# Patient Record
Sex: Female | Born: 1935 | Race: Black or African American | Hispanic: No | Smoking: Never smoker
Health system: Southern US, Community
[De-identification: ages and names within clinical notes are randomized; demographics above are authoritative.]

## PROBLEM LIST (undated history)

## (undated) DIAGNOSIS — R059 Cough, unspecified: Secondary | ICD-10-CM

## (undated) DIAGNOSIS — J189 Pneumonia, unspecified organism: Secondary | ICD-10-CM

## (undated) DIAGNOSIS — Z972 Presence of dental prosthetic device (complete) (partial): Secondary | ICD-10-CM

## (undated) DIAGNOSIS — G4730 Sleep apnea, unspecified: Secondary | ICD-10-CM

## (undated) DIAGNOSIS — I10 Essential (primary) hypertension: Secondary | ICD-10-CM

## (undated) DIAGNOSIS — E039 Hypothyroidism, unspecified: Secondary | ICD-10-CM

## (undated) DIAGNOSIS — F32A Depression, unspecified: Secondary | ICD-10-CM

## (undated) DIAGNOSIS — E079 Disorder of thyroid, unspecified: Secondary | ICD-10-CM

## (undated) DIAGNOSIS — R42 Dizziness and giddiness: Secondary | ICD-10-CM

## (undated) DIAGNOSIS — R05 Cough: Secondary | ICD-10-CM

## (undated) DIAGNOSIS — F329 Major depressive disorder, single episode, unspecified: Secondary | ICD-10-CM

## (undated) DIAGNOSIS — M1990 Unspecified osteoarthritis, unspecified site: Secondary | ICD-10-CM

## (undated) DIAGNOSIS — H9190 Unspecified hearing loss, unspecified ear: Secondary | ICD-10-CM

## (undated) DIAGNOSIS — J302 Other seasonal allergic rhinitis: Secondary | ICD-10-CM

## (undated) DIAGNOSIS — E119 Type 2 diabetes mellitus without complications: Secondary | ICD-10-CM

## (undated) HISTORY — DX: Disorder of thyroid, unspecified: E07.9

## (undated) HISTORY — PX: TUBAL LIGATION: SHX77

## (undated) HISTORY — PX: ABDOMINAL HYSTERECTOMY: SHX81

## (undated) HISTORY — PX: COLONOSCOPY: SHX174

## (undated) HISTORY — PX: CHOLECYSTECTOMY: SHX55

---

## 2006-02-01 ENCOUNTER — Ambulatory Visit: Payer: Self-pay | Admitting: Family Medicine

## 2007-04-12 ENCOUNTER — Ambulatory Visit: Payer: Self-pay | Admitting: Family Medicine

## 2007-09-14 ENCOUNTER — Ambulatory Visit: Payer: Self-pay | Admitting: Family Medicine

## 2007-09-27 ENCOUNTER — Ambulatory Visit: Payer: Self-pay | Admitting: Family Medicine

## 2007-10-12 ENCOUNTER — Ambulatory Visit: Payer: Self-pay | Admitting: Family Medicine

## 2007-10-17 ENCOUNTER — Ambulatory Visit: Payer: Self-pay | Admitting: General Surgery

## 2007-10-24 ENCOUNTER — Ambulatory Visit: Payer: Self-pay | Admitting: General Surgery

## 2009-03-18 ENCOUNTER — Ambulatory Visit: Payer: Self-pay | Admitting: Family Medicine

## 2009-08-16 IMAGING — MG MM CAD SCREENING MAMMO
1 series · 4 of 4 positions shown · non-contrast
Comparison: none

REASON FOR EXAM: Screening Mammo
COMMENTS:

PROCEDURE:     MAM - MAM DGTL SCREENING MAMMO W/CAD  - March 18, 2009  [DATE]
RESULT:     Comparison is made to prior examinations of 04-12-07 and 02-01-06.
There are scattered fibroglandular densities bilaterally. No dominant mass
or malignant appearing calcifications are seen.

[R CC · right · 4 of 4 slices shown]
[im 1/4]
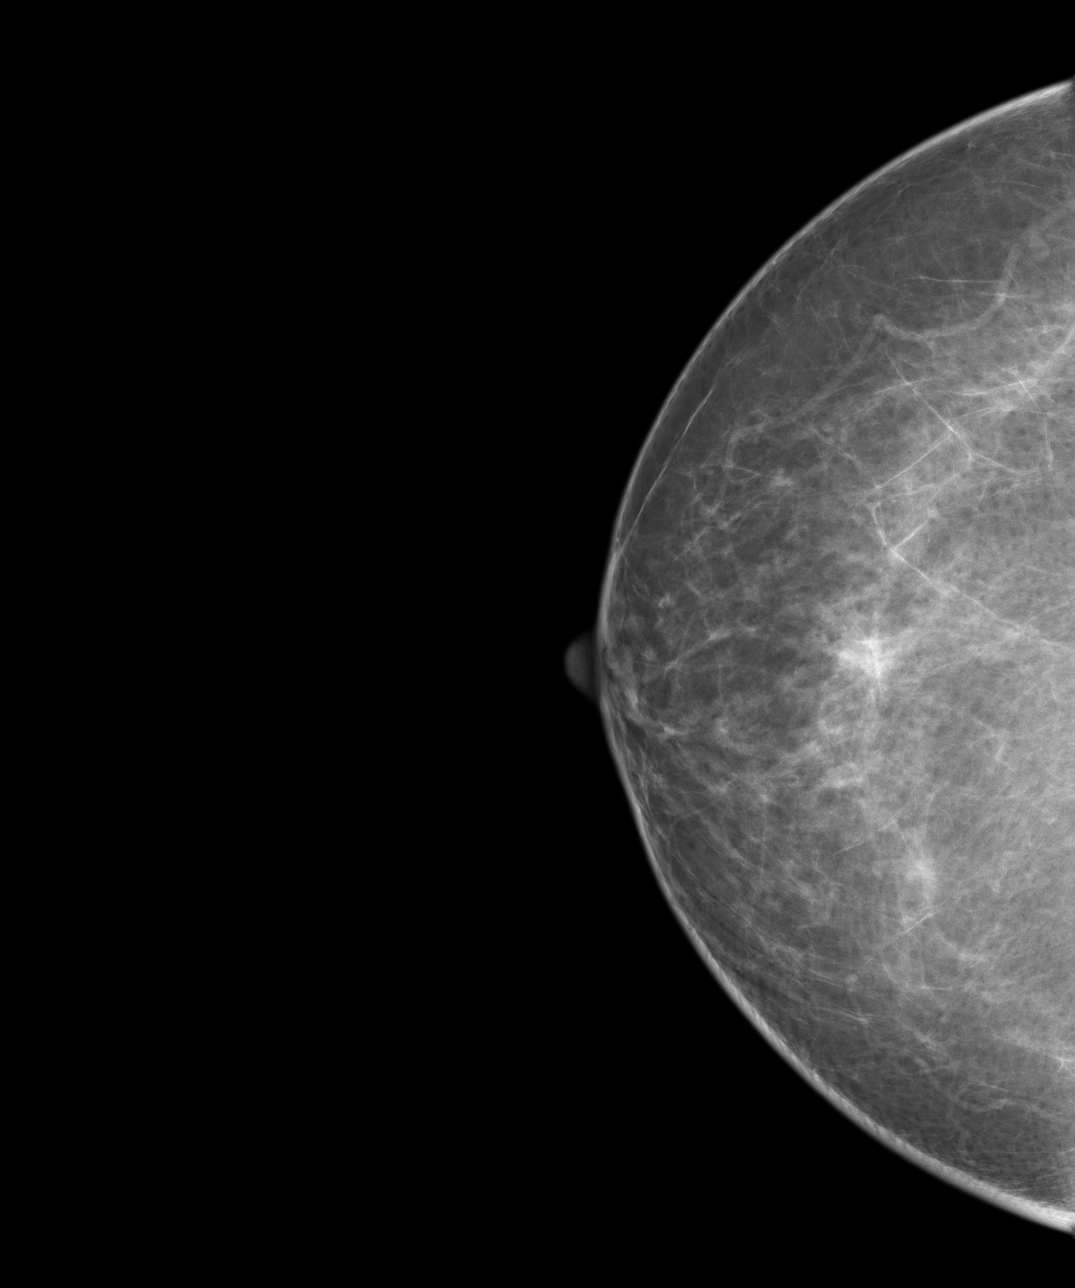
[im 2/4]
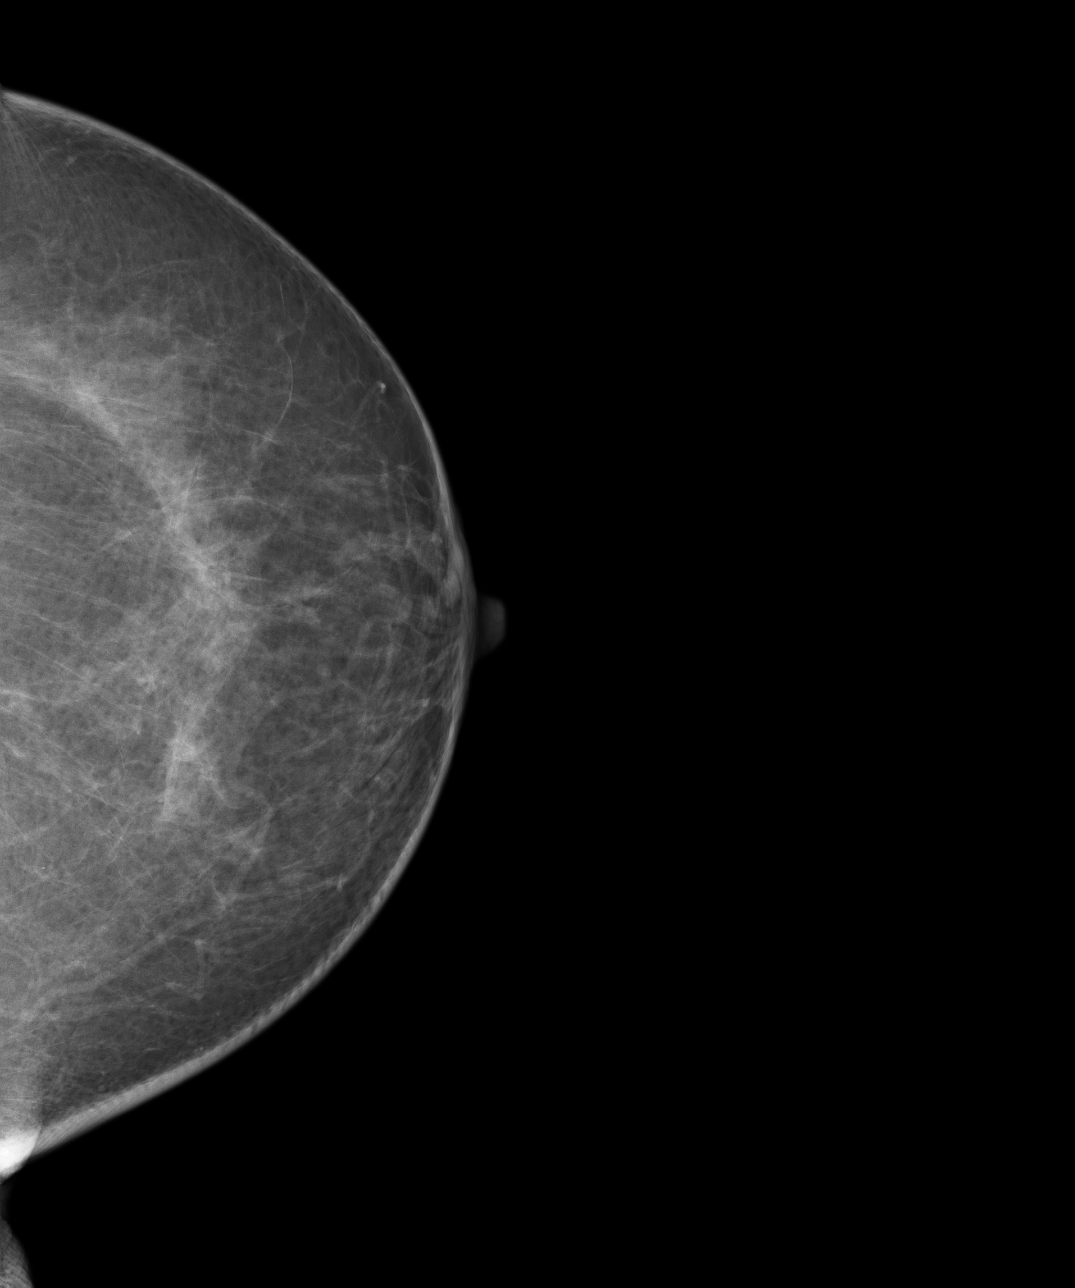
[im 3/4]
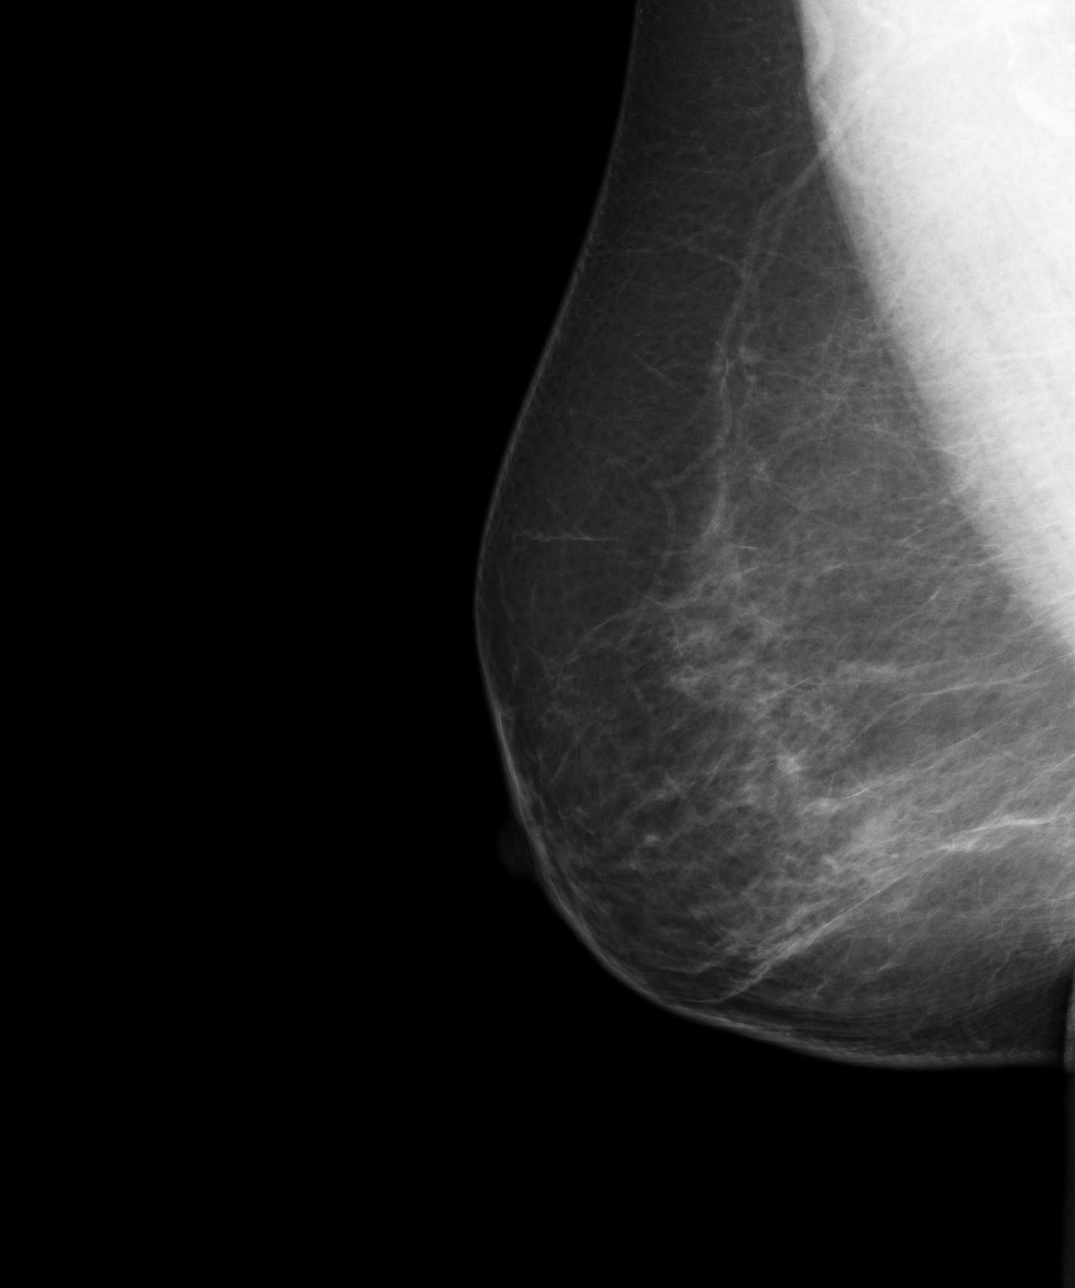
[im 4/4]
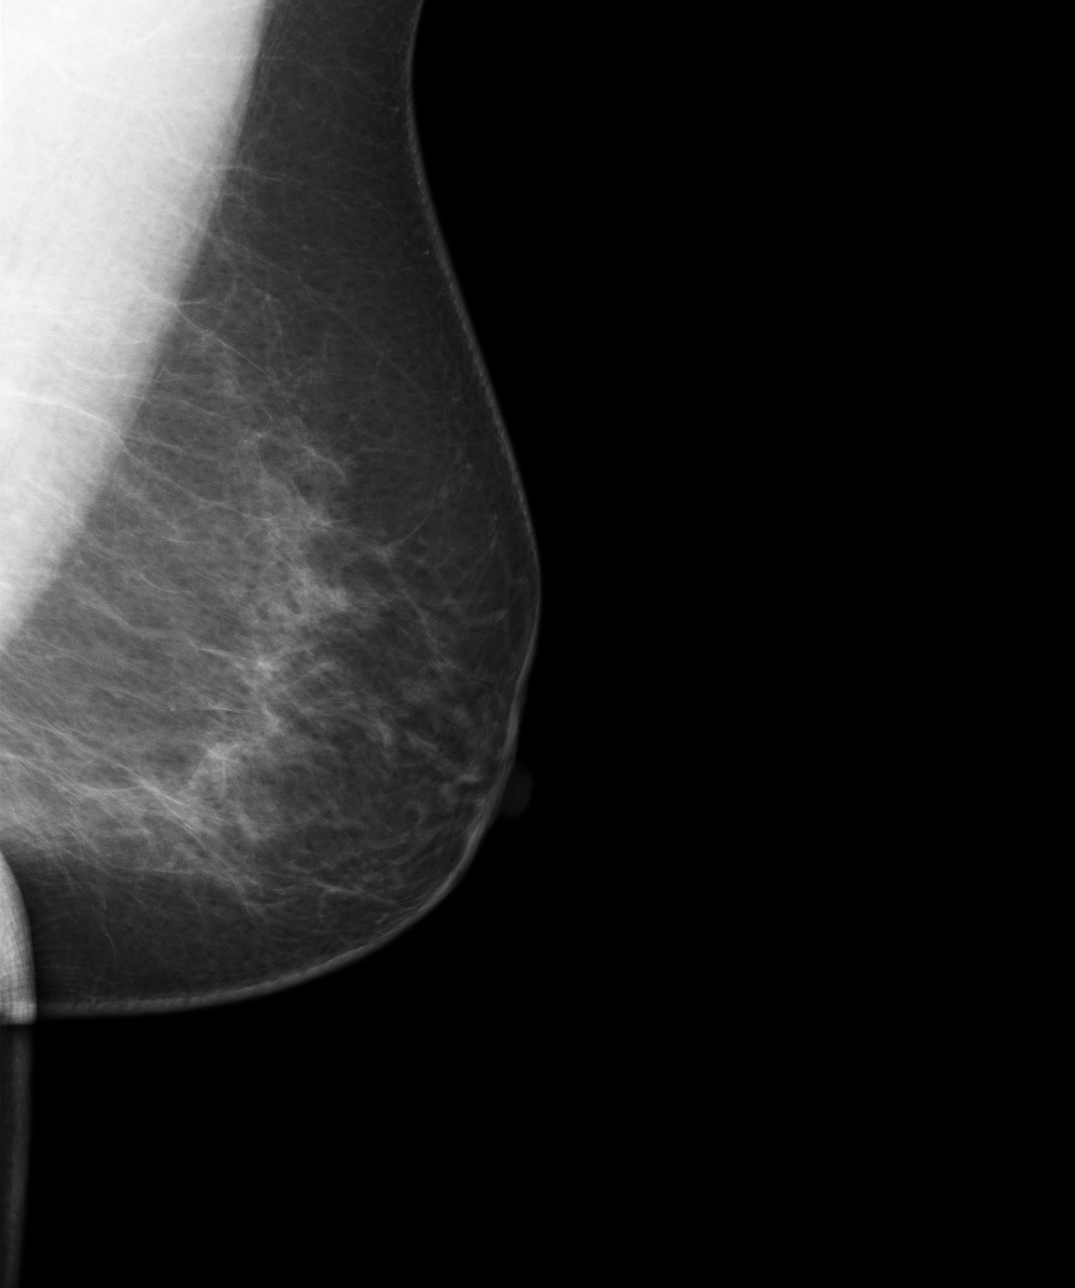

[4 of 4 positions shown; findings below may reference images not displayed]

IMPRESSION: 1. Bilaterally, benign appearing screening mammography.

2. Annual screening mammography is recommended.

BI-RADS: Category 1-Negative

A NEGATIVE MAMMOGRAM REPORT DOES NOT PRECLUDE BIOPSY OR OTHER EVALUATION OF
A CLINICALLY PALPABLE OR OTHERWISE SUSPICIOUS MASS OR LESION. BREAST CANCER
MAY NOT BE DETECTED BY MAMMOGRAPHY IN UP TO 10% OF CASES.

## 2010-04-09 ENCOUNTER — Ambulatory Visit: Payer: Self-pay | Admitting: Gastroenterology

## 2013-04-01 ENCOUNTER — Emergency Department: Payer: Self-pay | Admitting: Emergency Medicine

## 2013-04-01 LAB — COMPREHENSIVE METABOLIC PANEL
Albumin: 3.5 g/dL (ref 3.4–5.0)
Alkaline Phosphatase: 83 U/L (ref 50–136)
BUN: 24 mg/dL — ABNORMAL HIGH (ref 7–18)
Bilirubin,Total: 0.5 mg/dL (ref 0.2–1.0)
Calcium, Total: 8.5 mg/dL (ref 8.5–10.1)
Creatinine: 0.94 mg/dL (ref 0.60–1.30)
EGFR (African American): >60
Glucose: 139 mg/dL — ABNORMAL HIGH (ref 65–99)
SGOT(AST): 24 U/L (ref 15–37)
SGPT (ALT): 24 U/L (ref 12–78)
Sodium: 140 mmol/L (ref 136–145)

## 2013-04-01 LAB — CBC
HCT: 39 % (ref 35.0–47.0)
HGB: 12.6 g/dL (ref 12.0–16.0)
MCHC: 32.3 g/dL (ref 32.0–36.0)
MCV: 73 fL — ABNORMAL LOW (ref 80–100)
Platelet: 102 10*3/uL — ABNORMAL LOW (ref 150–440)
RBC: 5.31 10*6/uL — ABNORMAL HIGH (ref 3.80–5.20)
WBC: 7.3 10*3/uL (ref 3.6–11.0)

## 2013-04-01 LAB — CK TOTAL AND CKMB (NOT AT ARMC)
CK, Total: 53 U/L (ref 21–215)
CK-MB: <0.5 ng/mL — ABNORMAL LOW (ref 0.5–3.6)

## 2013-10-24 ENCOUNTER — Ambulatory Visit: Payer: Self-pay | Admitting: Internal Medicine

## 2015-02-13 ENCOUNTER — Ambulatory Visit (INDEPENDENT_AMBULATORY_CARE_PROVIDER_SITE_OTHER): Payer: Medicare Other | Admitting: Podiatry

## 2015-02-13 ENCOUNTER — Encounter: Payer: Self-pay | Admitting: Podiatry

## 2015-02-13 VITALS — BP 159/85 | HR 74 | Resp 18

## 2015-02-13 DIAGNOSIS — B351 Tinea unguium: Secondary | ICD-10-CM

## 2015-02-13 DIAGNOSIS — M79676 Pain in unspecified toe(s): Secondary | ICD-10-CM

## 2015-02-13 NOTE — Progress Notes (Signed)
   Subjective:    Patient ID: Lauren Riddle, female    DOB: Nov 01, 1926, 81 y.o.   MRN: [id number]  HPI  81 year old female presents the office today with combines a painful, elongated toenails. She states the nails are sore and tender to touch. She denies any recent redness or drainage from around the nail sites. She denies any history of ulceration, tingling/numbness or any intermittent claudication symptoms. No other complaints this time.   Review of Systems  Gastrointestinal: Positive for constipation.  Endocrine:       Excessive thirst  Allergic/Immunologic: Positive for food allergies.  All other systems reviewed and are negative.      Objective:   Physical Exam AAO 3, NAD DP/PT pulses palpable, CRT less than 3 seconds Protective sensation intact with Simms Weinstein monofilament, vibratory sensation intact, Achilles tendon reflex intact. Nails hypertrophic, dystrophic, elongated, brittle, discolored 10. There subjective tenderness on the nails 1-5 bilaterally. No surrounding erythema or drainage along the nail sites. No open lesions or pre-ulcer lesions identified bilaterally. No interdigital maceration. No other areas of tenderness bilateral lower extremities. MMT 5/5, ROM WNL No pain with calf compression, swelling, warmth, erythema.     Assessment & Plan:  81 year old female with symptomatic onychomycosis  -Treatment options discussed including alternatives, risks, complications  -Nail sharply debrided 10 without complication/bleeding  -Discussed importance daily foot inspection.  -Follow-up in 3 months or sooner if any problems are to arise. In the meantime, encouraged to call the office with any questions, concerns, change in symptoms.

## 2015-02-13 NOTE — Patient Instructions (Signed)
Diabetes and Foot Care Diabetes may cause you to have problems because of poor blood supply (circulation) to your feet and legs. This may cause the skin on your feet to become thinner, break easier, and heal more slowly. Your skin may become dry, and the skin may peel and crack. You may also have nerve damage in your legs and feet causing decreased feeling in them. You may not notice minor injuries to your feet that could lead to infections or more serious problems. Taking care of your feet is one of the most important things you can do for yourself.  HOME CARE INSTRUCTIONS  Wear shoes at all times, even in the house. Do not go barefoot. Bare feet are easily injured.  Check your feet daily for blisters, cuts, and redness. If you cannot see the bottom of your feet, use a mirror or ask someone for help.  Wash your feet with warm water (do not use hot water) and mild soap. Then pat your feet and the areas between your toes until they are completely dry. Do not soak your feet as this can dry your skin.  Apply a moisturizing lotion or petroleum jelly (that does not contain alcohol and is unscented) to the skin on your feet and to dry, brittle toenails. Do not apply lotion between your toes.  Trim your toenails straight across. Do not dig under them or around the cuticle. File the edges of your nails with an emery board or nail file.  Do not cut corns or calluses or try to remove them with medicine.  Wear clean socks or stockings every day. Make sure they are not too tight. Do not wear knee-high stockings since they may decrease blood flow to your legs.  Wear shoes that fit properly and have enough cushioning. To break in new shoes, wear them for just a few hours a day. This prevents you from injuring your feet. Always look in your shoes before you put them on to be sure there are no objects inside.  Do not cross your legs. This may decrease the blood flow to your feet.  If you find a minor scrape,  cut, or break in the skin on your feet, keep it and the skin around it clean and dry. These areas may be cleansed with mild soap and water. Do not cleanse the area with peroxide, alcohol, or iodine.  When you remove an adhesive bandage, be sure not to damage the skin around it.  If you have a wound, look at it several times a day to make sure it is healing.  Do not use heating pads or hot water bottles. They may burn your skin. If you have lost feeling in your feet or legs, you may not know it is happening until it is too late.  Make sure your health care provider performs a complete foot exam at least annually or more often if you have foot problems. Report any cuts, sores, or bruises to your health care provider immediately. SEEK MEDICAL CARE IF:   You have an injury that is not healing.  You have cuts or breaks in the skin.  You have an ingrown nail.  You notice redness on your legs or feet.  You feel burning or tingling in your legs or feet.  You have pain or cramps in your legs and feet.  Your legs or feet are numb.  Your feet always feel cold. SEEK IMMEDIATE MEDICAL CARE IF:   There is increasing redness,   swelling, or pain in or around a wound.  There is a red line that goes up your leg.  Pus is coming from a wound.  You develop a fever or as directed by your health care provider.  You notice a bad smell coming from an ulcer or wound. Document Released: 12/10/2000 Document Revised: 08/15/2013 Document Reviewed: 05/22/2013 Exit[employer] Patient Information 2015 [employer]. This information is not intended to replace advice given to you by your health care provider. Make sure you discuss any questions you have with your health care provider.  

## 2015-05-22 ENCOUNTER — Ambulatory Visit: Payer: Medicare Other

## 2015-09-03 ENCOUNTER — Ambulatory Visit: Payer: Medicare Other

## 2015-09-25 ENCOUNTER — Ambulatory Visit: Payer: Medicare Other

## 2017-04-19 NOTE — Discharge Instructions (Signed)
Cataract Surgery, Care After °Refer to this sheet in the next few weeks. These instructions provide you with information about caring for yourself after your procedure. Your health care provider may also give you more specific instructions. Your treatment has been planned according to current medical practices, but problems sometimes occur. Call your health care provider if you have any problems or questions after your procedure. °What can I expect after the procedure? °After the procedure, it is common to have: °· Itching. °· Discomfort. °· Fluid discharge. °· Sensitivity to light and to touch. °· Bruising. °Follow these instructions at home: °Eye Care  °· Check your eye every day for signs of infection. Watch for: °¨ Redness, swelling, or pain. °¨ Fluid, blood, or pus. °¨ Warmth. °¨ Bad smell. °Activity  °· Avoid strenuous activities, such as playing contact sports, for as long as told by your health care provider. °· Do not drive or operate heavy machinery until your health care provider approves. °· Do not bend or lift heavy objects . Bending increases pressure in the eye. You can walk, climb stairs, and do light household chores. °· Ask your health care provider when you can return to work. If you work in a dusty environment, you may be advised to wear protective eyewear for a period of time. °General instructions  °· Take or apply over-the-counter and prescription medicines only as told by your health care provider. This includes eye drops. °· Do not touch or rub your eyes. °· If you were given a protective shield, wear it as told by your health care provider. If you were not given a protective shield, wear sunglasses as told by your health care provider to protect your eyes. °· Keep the area around your eye clean and dry. Avoid swimming or allowing water to hit you directly in the face while showering until told by your health care provider. Keep soap and shampoo out of your eyes. °· Do not put a contact lens  into the affected eye or eyes until your health care provider approves. °· Keep all follow-up visits as told by your health care provider. This is important. °Contact a health care provider if: ° °· You have increased bruising around your eye. °· You have pain that is not helped with medicine. °· You have a fever. °· You have redness, swelling, or pain in your eye. °· You have fluid, blood, or pus coming from your incision. °· Your vision gets worse. °Get help right away if: °· You have sudden vision loss. °This information is not intended to replace advice given to you by your health care provider. Make sure you discuss any questions you have with your health care provider. °Document Released: 07/02/2005 Document Revised: 04/22/2016 Document Reviewed: 10/23/2015 °Elsevier Interactive Patient Education © 2017 [employer] ° ° ° ° °General Anesthesia, Adult, Care After °These instructions provide you with information about caring for yourself after your procedure. Your health care provider may also give you more specific instructions. Your treatment has been planned according to current medical practices, but problems sometimes occur. Call your health care provider if you have any problems or questions after your procedure. °What can I expect after the procedure? °After the procedure, it is common to have: °· Vomiting. °· A sore throat. °· Mental slowness. °It is common to feel: °· Nauseous. °· Cold or shivery. °· Sleepy. °· Tired. °· Sore or achy, even in parts of your body where you did not have surgery. °Follow these instructions at   home: °For at least 24 hours after the procedure:  °· Do not: °¨ Participate in activities where you could fall or become injured. °¨ Drive. °¨ Use heavy machinery. °¨ Drink alcohol. °¨ Take sleeping pills or medicines that cause drowsiness. °¨ Make important decisions or sign legal documents. °¨ Take care of children on your own. °· Rest. °Eating and drinking  °· If you vomit, drink  water, juice, or soup when you can drink without vomiting. °· Drink enough fluid to keep your urine clear or pale yellow. °· Make sure you have little or no nausea before eating solid foods. °· Follow the diet recommended by your health care provider. °General instructions  °· Have a responsible adult stay with you until you are awake and alert. °· Return to your normal activities as told by your health care provider. Ask your health care provider what activities are safe for you. °· Take over-the-counter and prescription medicines only as told by your health care provider. °· If you smoke, do not smoke without supervision. °· Keep all follow-up visits as told by your health care provider. This is important. °Contact a health care provider if: °· You continue to have nausea or vomiting at home, and medicines are not helpful. °· You cannot drink fluids or start eating again. °· You cannot urinate after 8-12 hours. °· You develop a skin rash. °· You have fever. °· You have increasing redness at the site of your procedure. °Get help right away if: °· You have difficulty breathing. °· You have chest pain. °· You have unexpected bleeding. °· You feel that you are having a life-threatening or urgent problem. °This information is not intended to replace advice given to you by your health care provider. Make sure you discuss any questions you have with your health care provider. °Document Released: 03/21/2001 Document Revised: 05/17/2016 Document Reviewed: 11/27/2015 °Elsevier Interactive Patient Education © 2017 [employer] ° °

## 2017-04-20 ENCOUNTER — Ambulatory Visit: Payer: Medicare Other | Admitting: Anesthesiology

## 2017-04-20 ENCOUNTER — Ambulatory Visit
Admission: RE | Admit: 2017-04-20 | Discharge: 2017-04-20 | Disposition: A | Discharge status: Home / Self Care | Payer: Medicare Other | Source: Ambulatory Visit | Attending: Ophthalmology | Admitting: Ophthalmology

## 2017-04-20 ENCOUNTER — Encounter
Admission: RE | Disposition: A | Discharge status: Home / Self Care | Payer: Self-pay | Source: Ambulatory Visit | Attending: Ophthalmology

## 2017-04-20 DIAGNOSIS — G4730 Sleep apnea, unspecified: Secondary | ICD-10-CM | POA: Diagnosis not present

## 2017-04-20 DIAGNOSIS — E1136 Type 2 diabetes mellitus with diabetic cataract: Secondary | ICD-10-CM | POA: Diagnosis not present

## 2017-04-20 DIAGNOSIS — I10 Essential (primary) hypertension: Secondary | ICD-10-CM | POA: Insufficient documentation

## 2017-04-20 HISTORY — DX: Type 2 diabetes mellitus without complications: E11.9

## 2017-04-20 HISTORY — DX: Unspecified osteoarthritis, unspecified site: M19.90

## 2017-04-20 HISTORY — DX: Essential (primary) hypertension: I10

## 2017-04-20 HISTORY — DX: Cough, unspecified: R05.9

## 2017-04-20 HISTORY — DX: Unspecified hearing loss, unspecified ear: H91.90

## 2017-04-20 HISTORY — DX: Cough: R05

## 2017-04-20 HISTORY — PX: CATARACT EXTRACTION W/PHACO: SHX586

## 2017-04-20 HISTORY — DX: Pneumonia, unspecified organism: J18.9

## 2017-04-20 HISTORY — DX: Dizziness and giddiness: R42

## 2017-04-20 HISTORY — DX: Major depressive disorder, single episode, unspecified: F32.9

## 2017-04-20 HISTORY — DX: Depression, unspecified: F32.A

## 2017-04-20 HISTORY — DX: Sleep apnea, unspecified: G47.30

## 2017-04-20 HISTORY — DX: Other seasonal allergic rhinitis: J30.2

## 2017-04-20 HISTORY — DX: Presence of dental prosthetic device (complete) (partial): Z97.2

## 2017-04-20 LAB — GLUCOSE, CAPILLARY
GLUCOSE-CAPILLARY: 122 mg/dL — AB (ref 65–99)
GLUCOSE-CAPILLARY: 134 mg/dL — AB (ref 65–99)

## 2017-04-20 SURGERY — PHACOEMULSIFICATION, CATARACT, WITH IOL INSERTION
Anesthesia: Monitor Anesthesia Care | Site: Eye | Laterality: Right | Wound class: Clean

## 2017-04-20 MED ORDER — FENTANYL CITRATE (PF) 100 MCG/2ML IJ SOLN
INTRAMUSCULAR | Status: DC | PRN
  Administered 2017-04-20: 50 ug via INTRAVENOUS

## 2017-04-20 MED ORDER — CEFUROXIME OPHTHALMIC INJECTION 1 MG/0.1 ML
INJECTION | OPHTHALMIC | Status: DC | PRN
  Administered 2017-04-20: .3 mL via INTRACAMERAL

## 2017-04-20 MED ORDER — NA HYALUR & NA CHOND-NA HYALUR 0.4-0.35 ML IO KIT
PACK | INTRAOCULAR | Status: DC | PRN
  Administered 2017-04-20: 1 mL via INTRAOCULAR

## 2017-04-20 MED ORDER — EPINEPHRINE PF 1 MG/ML IJ SOLN
INTRAOCULAR | Status: DC | PRN
  Administered 2017-04-20: 69 mL via OPHTHALMIC

## 2017-04-20 MED ORDER — MIDAZOLAM HCL 2 MG/2ML IJ SOLN
INTRAMUSCULAR | Status: DC | PRN
  Administered 2017-04-20: 1 mg via INTRAVENOUS

## 2017-04-20 MED ORDER — LIDOCAINE HCL (PF) 2 % IJ SOLN
INTRAOCULAR | Status: DC | PRN
  Administered 2017-04-20: 1 mL via INTRAOCULAR

## 2017-04-20 MED ORDER — MOXIFLOXACIN HCL 0.5 % OP SOLN
1.0000 [drp] | OPHTHALMIC | Status: DC | PRN
  Administered 2017-04-20 (×3): 1 [drp] via OPHTHALMIC

## 2017-04-20 MED ORDER — LACTATED RINGERS IV SOLN: INTRAVENOUS | Status: DC

## 2017-04-20 MED ORDER — ACETAMINOPHEN 160 MG/5ML PO SOLN: 960.0000 mg | Freq: Once | ORAL | Status: DC

## 2017-04-20 MED ORDER — ACETAMINOPHEN 500 MG PO TABS: 1000.0000 mg | ORAL_TABLET | Freq: Once | ORAL | Status: DC

## 2017-04-20 MED ORDER — ARMC OPHTHALMIC DILATING DROPS
1.0000 "application " | OPHTHALMIC | Status: DC | PRN
  Administered 2017-04-20 (×3): 1 via OPHTHALMIC

## 2017-04-20 MED ORDER — OXYCODONE HCL 5 MG PO TABS: 5.0000 mg | ORAL_TABLET | Freq: Once | ORAL | Status: DC | PRN

## 2017-04-20 MED ORDER — BRIMONIDINE TARTRATE-TIMOLOL 0.2-0.5 % OP SOLN
OPHTHALMIC | Status: DC | PRN
  Administered 2017-04-20: 1 [drp] via OPHTHALMIC

## 2017-04-20 MED ORDER — OXYCODONE HCL 5 MG/5ML PO SOLN: 5.0000 mg | Freq: Once | ORAL | Status: DC | PRN

## 2017-04-20 SURGICAL SUPPLY — 27 items
CANNULA ANT/CHMB 27GA (MISCELLANEOUS) ×3 IMPLANT
CARTRIDGE ABBOTT (MISCELLANEOUS) IMPLANT
GLOVE SURG LX 7.5 STRW (GLOVE) ×2
GLOVE SURG LX STRL 7.5 STRW (GLOVE) ×1 IMPLANT
GLOVE SURG TRIUMPH 8.0 PF LTX (GLOVE) ×3 IMPLANT
GOWN STRL REUS W/ TWL LRG LVL3 (GOWN DISPOSABLE) ×2 IMPLANT
GOWN STRL REUS W/TWL LRG LVL3 (GOWN DISPOSABLE) ×4
KNIFE CLEARCUT SLIT (MISCELLANEOUS) ×3 IMPLANT
KNIFE SIDECUT EYE (MISCELLANEOUS) ×3 IMPLANT
LENS IOL TECNIS ITEC 21.0 (Intraocular Lens) ×3 IMPLANT
MARKER SKIN DUAL TIP RULER LAB (MISCELLANEOUS) ×3 IMPLANT
NDL RETROBULBAR .5 NSTRL (NEEDLE) IMPLANT
NEEDLE FILTER BLUNT 18X 1/2SAF (NEEDLE) ×2
NEEDLE FILTER BLUNT 18X1 1/2 (NEEDLE) ×1 IMPLANT
PACK CATARACT BRASINGTON (MISCELLANEOUS) IMPLANT
PACK EYE AFTER SURG (MISCELLANEOUS) ×3 IMPLANT
PACK OPTHALMIC (MISCELLANEOUS) ×3 IMPLANT
RING MALYGIN 7.0 (MISCELLANEOUS) IMPLANT
SUT ETHILON 10-0 CS-B-6CS-B-6 (SUTURE)
SUT VICRYL  9 0 (SUTURE)
SUT VICRYL 9 0 (SUTURE) IMPLANT
SUTURE EHLN 10-0 CS-B-6CS-B-6 (SUTURE) IMPLANT
SYR 3ML LL SCALE MARK (SYRINGE) ×3 IMPLANT
SYR 5ML LL (SYRINGE) ×3 IMPLANT
SYR TB 1ML LUER SLIP (SYRINGE) ×3 IMPLANT
WATER STERILE IRR 250ML POUR (IV SOLUTION) ×3 IMPLANT
WIPE NON LINTING 3.25X3.25 (MISCELLANEOUS) ×3 IMPLANT

## 2017-04-20 NOTE — Anesthesia Preprocedure Evaluation (Signed)
Anesthesia Evaluation  Patient identified by MRN, date of birth, ID band Patient awake    Reviewed: Allergy & Precautions, H&P , NPO status , Patient's Chart, lab work & pertinent test results  History of Anesthesia Complications Negative for: history of anesthetic complications  Airway Mallampati: II  TM Distance: >3 FB Neck ROM: full    Dental   Pulmonary sleep apnea ,    Pulmonary exam normal        Cardiovascular hypertension, Normal cardiovascular exam     Neuro/Psych    GI/Hepatic negative GI ROS, Neg liver ROS,   Endo/Other  diabetes  Renal/GU negative Renal ROS     Musculoskeletal   Abdominal   Peds  Hematology negative hematology ROS (+)   Anesthesia Other Findings   Reproductive/Obstetrics                             Anesthesia Physical Anesthesia Plan  ASA: II  Anesthesia Plan: MAC   Post-op Pain Management:    Induction:   Airway Management Planned:   Additional Equipment:   Intra-op Plan:   Post-operative Plan:   Informed Consent:   Plan Discussed with:   Anesthesia Plan Comments:         Anesthesia Quick Evaluation

## 2017-04-20 NOTE — Anesthesia Postprocedure Evaluation (Signed)
Anesthesia Post Note  Patient: Lauren Riddle  Procedure(s) Performed: Procedure(s) (LRB): CATARACT EXTRACTION PHACO AND INTRAOCULAR LENS PLACEMENT (IOC) Right Diabetic (Right)  Patient location during evaluation: PACU Anesthesia Type: MAC Level of consciousness: awake Pain management: pain level controlled Vital Signs Assessment: post-procedure vital signs reviewed and stable Respiratory status: spontaneous breathing Cardiovascular status: blood pressure returned to baseline Postop Assessment: no headache Anesthetic complications: no    Jaci Standard, III,  Calinda Stockinger D

## 2017-04-20 NOTE — H&P (Signed)
The History and Physical notes are on paper, have been signed, and are to be scanned. The patient remains stable and unchanged from the H&P.   Previous H&P reviewed, patient examined, and there are no changes.  Rin Gorton 04/20/2017 7:45 AM

## 2017-04-20 NOTE — Anesthesia Procedure Notes (Signed)
Procedure Name: MAC Performed by: Mayme Genta Pre-anesthesia Checklist: Patient identified, Emergency Drugs available, Suction available, Timeout performed and Patient being monitored Patient Re-evaluated:Patient Re-evaluated prior to inductionOxygen Delivery Method: Nasal cannula Placement Confirmation: positive ETCO2

## 2017-04-20 NOTE — Transfer of Care (Signed)
Immediate Anesthesia Transfer of Care Note  Patient: Lauren Riddle  Procedure(s) Performed: Procedure(s) with comments: CATARACT EXTRACTION PHACO AND INTRAOCULAR LENS PLACEMENT (IOC) Right Diabetic (Right) - diabetic-oral med and history of sleep apnea, no CPAP  Patient Location: PACU  Anesthesia Type: MAC  Level of Consciousness: awake, alert  and patient cooperative  Airway and Oxygen Therapy: Patient Spontanous Breathing and Patient connected to supplemental oxygen  Post-op Assessment: Post-op Vital signs reviewed, Patient's Cardiovascular Status Stable, Respiratory Function Stable, Patent Airway and No signs of Nausea or vomiting  Post-op Vital Signs: Reviewed and stable  Complications: No apparent anesthesia complications

## 2017-04-20 NOTE — Op Note (Signed)
LOCATION:  [place]   PREOPERATIVE DIAGNOSIS:    Nuclear sclerotic cataract right eye. H25.11   POSTOPERATIVE DIAGNOSIS:  Nuclear sclerotic cataract right eye.     PROCEDURE:  Phacoemusification with posterior chamber intraocular lens placement of the right eye   LENS:   Implant Name Type Inv. Item Serial No. Manufacturer Lot No. LRB No. Used  LENS IOL DIOP 21.0 - [id number] Intraocular Lens LENS IOL DIOP 21.0 [id number]   Right 1        ULTRASOUND TIME: 16 % of 1 minutes, 16 seconds.  CDE 3.5   SURGEON:  Wyonia Hough, MD   ANESTHESIA:  Topical with tetracaine drops and 2% Xylocaine jelly, augmented with 1% preservative-free intracameral lidocaine.    COMPLICATIONS:  None.   DESCRIPTION OF PROCEDURE:  The patient was identified in the holding room and transported to the operating room and placed in the supine position under the operating microscope.  The right eye was identified as the operative eye and it was prepped and draped in the usual sterile ophthalmic fashion.   A 1 millimeter clear-corneal paracentesis was made at the 12:00 position.  0.5 ml of preservative-free 1% lidocaine was injected into the anterior chamber. The anterior chamber was filled with Viscoat viscoelastic.  A 2.4 millimeter keratome was used to make a near-clear corneal incision at the 9:00 position.  A curvilinear capsulorrhexis was made with a cystotome and capsulorrhexis forceps.  Balanced salt solution was used to hydrodissect and hydrodelineate the nucleus.   Phacoemulsification was then used in stop and chop fashion to remove the lens nucleus and epinucleus.  The remaining cortex was then removed using the irrigation and aspiration handpiece. Provisc was then placed into the capsular bag to distend it for lens placement.  A lens was then injected into the capsular bag.  The remaining viscoelastic was aspirated.   Wounds were hydrated with balanced salt solution.  The anterior  chamber was inflated to a physiologic pressure with balanced salt solution.  No wound leaks were noted. Cefuroxime 0.1 ml of a 10mg /ml solution was injected into the anterior chamber for a dose of 1 mg of intracameral antibiotic at the completion of the case.   Timolol and Brimonidine drops were applied to the eye.  The patient was taken to the recovery room in stable condition without complications of anesthesia or surgery.   Nandan Willems 04/20/2017, 8:09 AM

## 2017-04-21 ENCOUNTER — Encounter: Payer: Self-pay | Admitting: Ophthalmology

## 2018-03-29 ENCOUNTER — Encounter: Payer: Self-pay | Admitting: *Deleted

## 2018-03-29 ENCOUNTER — Other Ambulatory Visit: Payer: Self-pay

## 2018-03-29 NOTE — Discharge Instructions (Signed)

## 2018-04-05 ENCOUNTER — Ambulatory Visit: Payer: Medicare Other | Admitting: Anesthesiology

## 2018-04-05 ENCOUNTER — Ambulatory Visit
Admission: RE | Admit: 2018-04-05 | Discharge: 2018-04-05 | Disposition: A | Discharge status: Home / Self Care | Payer: Medicare Other | Source: Ambulatory Visit | Attending: Ophthalmology | Admitting: Ophthalmology

## 2018-04-05 ENCOUNTER — Encounter
Admission: RE | Disposition: A | Discharge status: Home / Self Care | Payer: Self-pay | Source: Ambulatory Visit | Attending: Ophthalmology

## 2018-04-05 DIAGNOSIS — H2512 Age-related nuclear cataract, left eye: Secondary | ICD-10-CM | POA: Insufficient documentation

## 2018-04-05 DIAGNOSIS — E1136 Type 2 diabetes mellitus with diabetic cataract: Secondary | ICD-10-CM | POA: Diagnosis not present

## 2018-04-05 HISTORY — PX: CATARACT EXTRACTION W/PHACO: SHX586

## 2018-04-05 LAB — GLUCOSE, CAPILLARY: Glucose-Capillary: 121 mg/dL — ABNORMAL HIGH (ref 65–99)

## 2018-04-05 SURGERY — PHACOEMULSIFICATION, CATARACT, WITH IOL INSERTION
Anesthesia: Monitor Anesthesia Care | Laterality: Left | Wound class: "Clean "

## 2018-04-05 MED ORDER — BRIMONIDINE TARTRATE-TIMOLOL 0.2-0.5 % OP SOLN
OPHTHALMIC | Status: DC | PRN
  Administered 2018-04-05: 1 [drp] via OPHTHALMIC

## 2018-04-05 MED ORDER — ARMC OPHTHALMIC DILATING DROPS
1.0000 "application " | OPHTHALMIC | Status: DC | PRN
  Administered 2018-04-05 (×3): 1 via OPHTHALMIC

## 2018-04-05 MED ORDER — OXYCODONE HCL 5 MG PO TABS: 5.0000 mg | ORAL_TABLET | Freq: Once | ORAL | Status: DC | PRN

## 2018-04-05 MED ORDER — EPINEPHRINE PF 1 MG/ML IJ SOLN
INTRAOCULAR | Status: DC | PRN
  Administered 2018-04-05: 73 mL via OPHTHALMIC

## 2018-04-05 MED ORDER — MIDAZOLAM HCL 2 MG/2ML IJ SOLN
INTRAMUSCULAR | Status: DC | PRN
  Administered 2018-04-05: 0.5 mg via INTRAVENOUS

## 2018-04-05 MED ORDER — MOXIFLOXACIN HCL 0.5 % OP SOLN
1.0000 [drp] | OPHTHALMIC | Status: DC | PRN
  Administered 2018-04-05 (×3): 1 [drp] via OPHTHALMIC

## 2018-04-05 MED ORDER — NA HYALUR & NA CHOND-NA HYALUR 0.4-0.35 ML IO KIT
PACK | INTRAOCULAR | Status: DC | PRN
Start: 2018-04-05 — End: 2018-04-05
  Administered 2018-04-05: 1 mL via INTRAOCULAR

## 2018-04-05 MED ORDER — OXYCODONE HCL 5 MG/5ML PO SOLN: 5.0000 mg | Freq: Once | ORAL | Status: DC | PRN

## 2018-04-05 MED ORDER — CEFUROXIME OPHTHALMIC INJECTION 1 MG/0.1 ML
INJECTION | OPHTHALMIC | Status: DC | PRN
  Administered 2018-04-05: 0.1 mL via INTRACAMERAL

## 2018-04-05 MED ORDER — FENTANYL CITRATE (PF) 100 MCG/2ML IJ SOLN
INTRAMUSCULAR | Status: DC | PRN
  Administered 2018-04-05: 25 ug via INTRAVENOUS

## 2018-04-05 MED ORDER — LACTATED RINGERS IV SOLN: 10.0000 mL/h | INTRAVENOUS | Status: DC

## 2018-04-05 MED ORDER — LIDOCAINE HCL (PF) 2 % IJ SOLN
INTRAMUSCULAR | Status: DC | PRN
  Administered 2018-04-05: 1 mL via INTRAMUSCULAR

## 2018-04-05 SURGICAL SUPPLY — 27 items
CANNULA ANT/CHMB 27G (MISCELLANEOUS) ×1 IMPLANT
CANNULA ANT/CHMB 27GA (MISCELLANEOUS) ×3 IMPLANT
CARTRIDGE ABBOTT (MISCELLANEOUS) IMPLANT
GLOVE SURG LX 7.5 STRW (GLOVE) ×2
GLOVE SURG LX STRL 7.5 STRW (GLOVE) ×1 IMPLANT
GLOVE SURG TRIUMPH 8.0 PF LTX (GLOVE) ×3 IMPLANT
GOWN STRL REUS W/ TWL LRG LVL3 (GOWN DISPOSABLE) ×2 IMPLANT
GOWN STRL REUS W/TWL LRG LVL3 (GOWN DISPOSABLE) ×4
LENS IOL TECNIS ITEC 21.0 (Intraocular Lens) ×2 IMPLANT
MARKER SKIN DUAL TIP RULER LAB (MISCELLANEOUS) ×3 IMPLANT
NDL FILTER BLUNT 18X1 1/2 (NEEDLE) ×1 IMPLANT
NDL RETROBULBAR .5 NSTRL (NEEDLE) IMPLANT
NEEDLE FILTER BLUNT 18X 1/2SAF (NEEDLE) ×2
NEEDLE FILTER BLUNT 18X1 1/2 (NEEDLE) ×1 IMPLANT
PACK CATARACT BRASINGTON (MISCELLANEOUS) ×3 IMPLANT
PACK EYE AFTER SURG (MISCELLANEOUS) ×3 IMPLANT
PACK OPTHALMIC (MISCELLANEOUS) ×3 IMPLANT
RING MALYGIN 7.0 (MISCELLANEOUS) IMPLANT
SUT ETHILON 10-0 CS-B-6CS-B-6 (SUTURE)
SUT VICRYL  9 0 (SUTURE)
SUT VICRYL 9 0 (SUTURE) IMPLANT
SUTURE EHLN 10-0 CS-B-6CS-B-6 (SUTURE) IMPLANT
SYR 3ML LL SCALE MARK (SYRINGE) ×3 IMPLANT
SYR 5ML LL (SYRINGE) ×3 IMPLANT
SYR TB 1ML LUER SLIP (SYRINGE) ×3 IMPLANT
WATER STERILE IRR 500ML POUR (IV SOLUTION) ×3 IMPLANT
WIPE NON LINTING 3.25X3.25 (MISCELLANEOUS) ×3 IMPLANT

## 2018-04-05 NOTE — Anesthesia Procedure Notes (Signed)
Procedure Name: MAC Performed by: Dannon Perlow, CRNA Pre-anesthesia Checklist: Patient identified, Emergency Drugs available, Suction available, Timeout performed and Patient being monitored Patient Re-evaluated:Patient Re-evaluated prior to induction Oxygen Delivery Method: Nasal cannula Placement Confirmation: positive ETCO2       

## 2018-04-05 NOTE — Anesthesia Preprocedure Evaluation (Signed)
Anesthesia Evaluation  Patient identified by MRN, date of birth, ID band  Reviewed: NPO status   History of Anesthesia Complications Negative for: history of anesthetic complications  Airway Mallampati: II  TM Distance: >3 FB Neck ROM: full    Dental  (+) Upper Dentures, Lower Dentures   Pulmonary neg pulmonary ROS,    Pulmonary exam normal        Cardiovascular Exercise Tolerance: Good hypertension, Normal cardiovascular exam     Neuro/Psych Depression HOH    GI/Hepatic negative GI ROS, Neg liver ROS,   Endo/Other  diabetesHypothyroidism   Renal/GU negative Renal ROS  negative genitourinary   Musculoskeletal  (+) Arthritis ,   Abdominal   Peds  Hematology negative hematology ROS (+)   Anesthesia Other Findings   Reproductive/Obstetrics                             Anesthesia Physical Anesthesia Plan  ASA: II  Anesthesia Plan: MAC   Post-op Pain Management:    Induction:   PONV Risk Score and Plan:   Airway Management Planned:   Additional Equipment:   Intra-op Plan:   Post-operative Plan:   Informed Consent: I have reviewed the patients History and Physical, chart, labs and discussed the procedure including the risks, benefits and alternatives for the proposed anesthesia with the patient or authorized representative who has indicated his/her understanding and acceptance.     Plan Discussed with: CRNA  Anesthesia Plan Comments:         Anesthesia Quick Evaluation

## 2018-04-05 NOTE — Anesthesia Postprocedure Evaluation (Signed)
Anesthesia Post Note  Patient: Lauren Riddle  Procedure(s) Performed: CATARACT EXTRACTION PHACO AND INTRAOCULAR LENS PLACEMENT (IOC) LEFT DIABETIC (Left )  Patient location during evaluation: PACU Anesthesia Type: MAC Level of consciousness: awake and alert Pain management: pain level controlled Vital Signs Assessment: post-procedure vital signs reviewed and stable Respiratory status: spontaneous breathing, nonlabored ventilation, respiratory function stable and patient connected to nasal cannula oxygen Cardiovascular status: stable and blood pressure returned to baseline Postop Assessment: no apparent nausea or vomiting Anesthetic complications: no    Chayla Shands

## 2018-04-05 NOTE — H&P (Signed)
The History and Physical notes are on paper, have been signed, and are to be scanned. The patient remains stable and unchanged from the H&P.   Previous H&P reviewed, patient examined, and there are no changes.  Lauren Riddle 04/05/2018 7:35 AM

## 2018-04-05 NOTE — Transfer of Care (Signed)
Immediate Anesthesia Transfer of Care Note  Patient: Lauren Riddle  Procedure(s) Performed: CATARACT EXTRACTION PHACO AND INTRAOCULAR LENS PLACEMENT (IOC) LEFT DIABETIC (Left )  Patient Location: PACU  Anesthesia Type: MAC  Level of Consciousness: awake, alert  and patient cooperative  Airway and Oxygen Therapy: Patient Spontanous Breathing and Patient connected to supplemental oxygen  Post-op Assessment: Post-op Vital signs reviewed, Patient's Cardiovascular Status Stable, Respiratory Function Stable, Patent Airway and No signs of Nausea or vomiting  Post-op Vital Signs: Reviewed and stable  Complications: No apparent anesthesia complications

## 2018-04-05 NOTE — Op Note (Signed)
OPERATIVE NOTE  BERANIA PEEDIN [id number] 04/05/2018   PREOPERATIVE DIAGNOSIS:  Nuclear sclerotic cataract left eye. H25.12   POSTOPERATIVE DIAGNOSIS:    Nuclear sclerotic cataract left eye.     PROCEDURE:  Phacoemusification with posterior chamber intraocular lens placement of the left eye   LENS:   Implant Name Type Inv. Item Serial No. Manufacturer Lot No. LRB No. Used  LENS IOL DIOP 21.0 - [id number] Intraocular Lens LENS IOL DIOP 21.0 [id number]  Left 1        ULTRASOUND TIME: 18  % of 1 minutes 22 seconds, CDE 14.4  SURGEON:  Wyonia Hough, MD   ANESTHESIA:  Topical with tetracaine drops and 2% Xylocaine jelly, augmented with 1% preservative-free intracameral lidocaine.    COMPLICATIONS:  None.   DESCRIPTION OF PROCEDURE:  The patient was identified in the holding room and transported to the operating room and placed in the supine position under the operating microscope.  The left eye was identified as the operative eye and it was prepped and draped in the usual sterile ophthalmic fashion.   A 1 millimeter clear-corneal paracentesis was made at the 1:30 position.  0.5 ml of preservative-free 1% lidocaine was injected into the anterior chamber.  The anterior chamber was filled with Viscoat viscoelastic.  A 2.4 millimeter keratome was used to make a near-clear corneal incision at the 10:30 position.  .  A curvilinear capsulorrhexis was made with a cystotome and capsulorrhexis forceps.  Balanced salt solution was used to hydrodissect and hydrodelineate the nucleus.   Phacoemulsification was then used in stop and chop fashion to remove the lens nucleus and epinucleus.  The remaining cortex was then removed using the irrigation and aspiration handpiece. Provisc was then placed into the capsular bag to distend it for lens placement.  A lens was then injected into the capsular bag.  The remaining viscoelastic was aspirated.   Wounds were hydrated with balanced salt  solution.  The anterior chamber was inflated to a physiologic pressure with balanced salt solution.  No wound leaks were noted. Cefuroxime 0.1 ml of a 10mg /ml solution was injected into the anterior chamber for a dose of 1 mg of intracameral antibiotic at the completion of the case.   Timolol and Brimonidine drops were applied to the eye.  The patient was taken to the recovery room in stable condition without complications of anesthesia or surgery.  Kalli Greenfield 04/05/2018, 8:00 AM

## 2018-04-06 ENCOUNTER — Encounter: Payer: Self-pay | Admitting: Ophthalmology

## 2019-10-22 ENCOUNTER — Other Ambulatory Visit: Payer: Self-pay

## 2019-10-22 DIAGNOSIS — Z20822 Contact with and (suspected) exposure to covid-19: Secondary | ICD-10-CM

## 2019-10-23 LAB — NOVEL CORONAVIRUS, NAA: SARS-CoV-2, NAA: NOT DETECTED

## 2020-01-18 ENCOUNTER — Ambulatory Visit: Payer: Medicare Other

## 2020-04-29 DIAGNOSIS — N1831 Chronic kidney disease, stage 3a: Secondary | ICD-10-CM | POA: Diagnosis present

## 2020-04-29 DIAGNOSIS — I10 Essential (primary) hypertension: Secondary | ICD-10-CM | POA: Diagnosis present

## 2020-04-29 DIAGNOSIS — E039 Hypothyroidism, unspecified: Secondary | ICD-10-CM | POA: Insufficient documentation

## 2020-04-29 DIAGNOSIS — E1169 Type 2 diabetes mellitus with other specified complication: Secondary | ICD-10-CM | POA: Diagnosis present

## 2020-04-29 DIAGNOSIS — E1122 Type 2 diabetes mellitus with diabetic chronic kidney disease: Secondary | ICD-10-CM | POA: Diagnosis present

## 2020-04-29 DIAGNOSIS — E7800 Pure hypercholesterolemia, unspecified: Secondary | ICD-10-CM | POA: Insufficient documentation

## 2020-04-29 DIAGNOSIS — E559 Vitamin D deficiency, unspecified: Secondary | ICD-10-CM | POA: Insufficient documentation

## 2020-05-01 DIAGNOSIS — E538 Deficiency of other specified B group vitamins: Secondary | ICD-10-CM | POA: Insufficient documentation

## 2020-05-01 DIAGNOSIS — D513 Other dietary vitamin B12 deficiency anemia: Secondary | ICD-10-CM | POA: Insufficient documentation

## 2020-05-05 DIAGNOSIS — R413 Other amnesia: Secondary | ICD-10-CM | POA: Insufficient documentation

## 2020-10-30 DIAGNOSIS — M170 Bilateral primary osteoarthritis of knee: Secondary | ICD-10-CM | POA: Insufficient documentation

## 2021-04-30 DIAGNOSIS — R42 Dizziness and giddiness: Secondary | ICD-10-CM | POA: Insufficient documentation

## 2021-08-20 DIAGNOSIS — F4323 Adjustment disorder with mixed anxiety and depressed mood: Secondary | ICD-10-CM | POA: Insufficient documentation

## 2021-09-29 DIAGNOSIS — J302 Other seasonal allergic rhinitis: Secondary | ICD-10-CM | POA: Insufficient documentation

## 2021-09-29 DIAGNOSIS — R600 Localized edema: Secondary | ICD-10-CM | POA: Insufficient documentation

## 2022-05-28 ENCOUNTER — Other Ambulatory Visit: Payer: Self-pay | Admitting: Gerontology

## 2022-05-28 DIAGNOSIS — R109 Unspecified abdominal pain: Secondary | ICD-10-CM

## 2022-06-10 ENCOUNTER — Ambulatory Visit: Payer: Medicare Other

## 2022-06-16 ENCOUNTER — Ambulatory Visit
Admission: RE | Admit: 2022-06-16 | Discharge: 2022-06-16 | Disposition: A | Discharge status: Home / Self Care | Payer: Medicare Other | Source: Ambulatory Visit | Attending: Gerontology | Admitting: Gerontology

## 2022-06-16 DIAGNOSIS — R109 Unspecified abdominal pain: Secondary | ICD-10-CM | POA: Diagnosis present

## 2022-06-18 DIAGNOSIS — I517 Cardiomegaly: Secondary | ICD-10-CM | POA: Insufficient documentation

## 2022-08-24 ENCOUNTER — Inpatient Hospital Stay: Payer: Medicare Other

## 2022-08-24 ENCOUNTER — Encounter: Payer: Self-pay | Admitting: Oncology

## 2022-08-24 ENCOUNTER — Inpatient Hospital Stay: Payer: Medicare Other | Attending: Oncology | Admitting: Oncology

## 2022-08-24 VITALS — BP 117/58 | HR 74 | Temp 98.7°F | Resp 20 | Wt 176.3 lb

## 2022-08-24 DIAGNOSIS — D696 Thrombocytopenia, unspecified: Secondary | ICD-10-CM

## 2022-08-24 DIAGNOSIS — D649 Anemia, unspecified: Secondary | ICD-10-CM

## 2022-08-24 DIAGNOSIS — R634 Abnormal weight loss: Secondary | ICD-10-CM | POA: Diagnosis not present

## 2022-08-24 DIAGNOSIS — D631 Anemia in chronic kidney disease: Secondary | ICD-10-CM | POA: Insufficient documentation

## 2022-08-24 DIAGNOSIS — R5383 Other fatigue: Secondary | ICD-10-CM | POA: Diagnosis not present

## 2022-08-24 LAB — COMPREHENSIVE METABOLIC PANEL
ALT: 11 U/L (ref 0–44)
AST: 19 U/L (ref 15–41)
Albumin: 3.7 g/dL (ref 3.5–5.0)
Alkaline Phosphatase: 58 U/L (ref 38–126)
Anion gap: 7 (ref 5–15)
BUN: 28 mg/dL — ABNORMAL HIGH (ref 8–23)
CO2: 28 mmol/L (ref 22–32)
Calcium: 8.9 mg/dL (ref 8.9–10.3)
Chloride: 102 mmol/L (ref 98–111)
Creatinine, Ser: 1.4 mg/dL — ABNORMAL HIGH (ref 0.44–1.00)
GFR, Estimated: 35 mL/min — ABNORMAL LOW (ref >60)
Glucose, Bld: 161 mg/dL — ABNORMAL HIGH (ref 70–99)
Potassium: 3.2 mmol/L — ABNORMAL LOW (ref 3.5–5.1)
Sodium: 137 mmol/L (ref 135–145)
Total Bilirubin: 0.2 mg/dL — ABNORMAL LOW (ref 0.3–1.2)
Total Protein: 7.3 g/dL (ref 6.5–8.1)

## 2022-08-24 LAB — CBC WITH DIFFERENTIAL/PLATELET
Abs Immature Granulocytes: 0.05 10*3/uL (ref 0.00–0.07)
Basophils Absolute: 0 10*3/uL (ref 0.0–0.1)
Basophils Relative: 0 %
Eosinophils Absolute: 0.1 10*3/uL (ref 0.0–0.5)
Eosinophils Relative: 1 %
HCT: 30.3 % — ABNORMAL LOW (ref 36.0–46.0)
Hemoglobin: 9.4 g/dL — ABNORMAL LOW (ref 12.0–15.0)
Immature Granulocytes: 1 %
Lymphocytes Relative: 19 %
Lymphs Abs: 0.7 10*3/uL (ref 0.7–4.0)
MCH: 23.6 pg — ABNORMAL LOW (ref 26.0–34.0)
MCHC: 31 g/dL (ref 30.0–36.0)
MCV: 75.9 fL — ABNORMAL LOW (ref 80.0–100.0)
Monocytes Absolute: 0.3 10*3/uL (ref 0.1–1.0)
Monocytes Relative: 7 %
Neutro Abs: 2.5 10*3/uL (ref 1.7–7.7)
Neutrophils Relative %: 72 %
Platelets: 142 10*3/uL — ABNORMAL LOW (ref 150–400)
RBC: 3.99 MIL/uL (ref 3.87–5.11)
RDW: 15.1 % (ref 11.5–15.5)
WBC: 3.5 10*3/uL — ABNORMAL LOW (ref 4.0–10.5)
nRBC: 0 % (ref 0.0–0.2)

## 2022-08-24 LAB — TECHNOLOGIST SMEAR REVIEW: Plt Morphology: ADEQUATE

## 2022-08-24 LAB — HEPATITIS PANEL, ACUTE
HCV Ab: NONREACTIVE
Hep A IgM: NONREACTIVE
Hep B C IgM: NONREACTIVE
Hepatitis B Surface Ag: NONREACTIVE

## 2022-08-24 LAB — RETIC PANEL
Immature Retic Fract: 7.9 % (ref 2.3–15.9)
RBC.: 3.84 MIL/uL — ABNORMAL LOW (ref 3.87–5.11)
Retic Count, Absolute: 49.2 10*3/uL (ref 19.0–186.0)
Retic Ct Pct: 1.3 % (ref 0.4–3.1)
Reticulocyte Hemoglobin: 26.2 pg — ABNORMAL LOW (ref >27.9)

## 2022-08-24 LAB — IRON AND TIBC
Iron: 62 ug/dL (ref 28–170)
Saturation Ratios: 18 % (ref 10.4–31.8)
TIBC: 342 ug/dL (ref 250–450)
UIBC: 280 ug/dL

## 2022-08-24 LAB — VITAMIN B12: Vitamin B-12: 1406 pg/mL — ABNORMAL HIGH (ref 180–914)

## 2022-08-24 LAB — TSH: TSH: 0.949 u[IU]/mL (ref 0.350–4.500)

## 2022-08-24 LAB — IMMATURE PLATELET FRACTION: Immature Platelet Fraction: 10 % — ABNORMAL HIGH (ref 1.2–8.6)

## 2022-08-24 LAB — LACTATE DEHYDROGENASE: LDH: 130 U/L (ref 98–192)

## 2022-08-24 LAB — FERRITIN: Ferritin: 23 ng/mL (ref 11–307)

## 2022-08-24 LAB — FOLATE: Folate: 14.4 ng/mL (ref >5.9)

## 2022-08-24 LAB — HIV ANTIBODY (ROUTINE TESTING W REFLEX): HIV Screen 4th Generation wRfx: NONREACTIVE

## 2022-08-24 NOTE — Assessment & Plan Note (Signed)
Previous labs were reviewed and discussed with patient. Acute on chronic anemia Check CBC, CMP, iron, TIBC ferritin, B12, folate, TSH reticulocyte panel, multiple myeloma panel, light chain ratio,

## 2022-08-24 NOTE — Assessment & Plan Note (Signed)
Check HIV, hepatitis and above work-up.

## 2022-08-24 NOTE — Progress Notes (Signed)
Hematology/Oncology Consult note [phone] [phone]      Patient Care Team: Mackey Birchwood, MD as PCP - General (Internal Medicine)   REFERRING PROVIDER: Latanya Maudlin, NP  CHIEF COMPLAINTS/REASON FOR VISIT:  Anemia  ASSESSMENT & PLAN:  Normocytic anemia Previous labs were reviewed and discussed with patient. Acute on chronic anemia Check CBC, CMP, iron, TIBC ferritin, B12, folate, TSH reticulocyte panel, multiple myeloma panel, light chain ratio,  Thrombocytopenia (HCC) Check HIV, hepatitis and above work-up.  Orders Placed This Encounter  Procedures   Ferritin    Standing Status:   Future    Number of Occurrences:   1    Standing Expiration Date:   02/24/2023   Folate    Standing Status:   Future    Number of Occurrences:   1    Standing Expiration Date:   08/25/2023   Vitamin B12    Standing Status:   Future    Number of Occurrences:   1    Standing Expiration Date:   08/25/2023   TSH    Standing Status:   Future    Number of Occurrences:   1    Standing Expiration Date:   08/25/2023   Comprehensive metabolic panel    Standing Status:   Future    Number of Occurrences:   1    Standing Expiration Date:   08/25/2023   CBC with Differential/Platelet    Standing Status:   Future    Number of Occurrences:   1    Standing Expiration Date:   08/25/2023   Retic Panel    Standing Status:   Future    Number of Occurrences:   1    Standing Expiration Date:   08/25/2023   Multiple Myeloma Panel (SPEP&IFE w/QIG)    Standing Status:   Future    Number of Occurrences:   1    Standing Expiration Date:   08/25/2023   Kappa/lambda light chains    Standing Status:   Future    Number of Occurrences:   1    Standing Expiration Date:   08/25/2023   Lactate dehydrogenase    Standing Status:   Future    Number of Occurrences:   1    Standing Expiration Date:   08/25/2023   Flow cytometry panel-leukemia/lymphoma work-up    Standing Status:   Future     Number of Occurrences:   1    Standing Expiration Date:   08/25/2023   Immature Platelet Fraction    Standing Status:   Future    Number of Occurrences:   1    Standing Expiration Date:   08/25/2023   HIV Antibody (routine testing w rflx)    Standing Status:   Future    Number of Occurrences:   1    Standing Expiration Date:   08/25/2023   Hepatitis panel, acute    Standing Status:   Future    Number of Occurrences:   1    Standing Expiration Date:   08/25/2023   Iron and TIBC    Standing Status:   Future    Number of Occurrences:   1    Standing Expiration Date:   08/25/2023   Technologist smear review    Standing Status:   Future    Number of Occurrences:   1    Standing Expiration Date:   08/25/2023    Order Specific Question:   Clinical information:    Answer:  anemia and thrombocytopenia   Recommend patient to follow-up in a couple weeks to review results. All questions were answered. The patient knows to call the clinic with any problems, questions or concerns.  Earlie Server, MD, PhD [facility] Hematology Oncology 08/24/2022     HISTORY OF PRESENTING ILLNESS:  Lauren Riddle is a  88 y.o.  female with PMH listed below who was referred to me for anemia Reviewed patient's recent labs that was done.  She was found to have abnormal CBC on 08/03/2022 hemoglobin 9.3, hematocrit 29.8, MCV 76.6.  Patient is chronically microcytic.  She has had better hemoglobin levels in the past. Reviewed patient's previous labs ordered by primary care physician's office, anemia is chronic onset  She denies recent chest pain on exertion, shortness of breath on minimal exertion, pre-syncopal episodes, or palpitations She had not noticed any recent bleeding such as epistaxis, hematuria or hematochezia. + Dark stool.  Patient takes oral iron supplementation. She takes Mobic daily.  She has noticed some gas/bloating.  No abdominal pain Her last colonoscopy was  She denies any pica and eats a variety  of diet.  Appetite is not great.  She noticed that she has lost couple of pounds.  Patient was accompanied by her son [name].    MEDICAL HISTORY:  Past Medical History:  Diagnosis Date   Arthritis    knees   Cough    chronic   Depression    Diabetes mellitus without complication (HCC)    HOH (hard of hearing)    Hypertension    controlled on meds   Pneumonia    in past   Seasonal allergies    Sleep apnea    per H&P, does not use CPAP   Thyroid disease    Vertigo    Wears dentures    full upper, partial lower    SURGICAL HISTORY: Past Surgical History:  Procedure Laterality Date   ABDOMINAL HYSTERECTOMY     CATARACT EXTRACTION W/PHACO Right 04/20/2017   Procedure: CATARACT EXTRACTION PHACO AND INTRAOCULAR LENS PLACEMENT ([place]) Right Diabetic;  Surgeon: Leandrew Koyanagi, MD;  Location: [place];  Service: Ophthalmology;  Laterality: Right;  diabetic-oral med and history of sleep apnea, no CPAP   CATARACT EXTRACTION W/PHACO Left 04/05/2018   Procedure: CATARACT EXTRACTION PHACO AND INTRAOCULAR LENS PLACEMENT ([place]) LEFT DIABETIC;  Surgeon: Leandrew Koyanagi, MD;  Location: [place];  Service: Ophthalmology;  Laterality: Left;  Diabetic - oral meds   CHOLECYSTECTOMY     COLONOSCOPY     TUBAL LIGATION      SOCIAL HISTORY: Social History   Socioeconomic History   Marital status: Widowed    Spouse name: Not on file   Number of children: Not on file   Years of education: Not on file   Highest education level: Not on file  Occupational History   Not on file  Tobacco Use   Smoking status: Never   Smokeless tobacco: Current    Types: Snuff  Vaping Use   Vaping Use: Never used  Substance and Sexual Activity   Alcohol use: No    Alcohol/week: 0.0 standard drinks of alcohol   Drug use: No   Sexual activity: Not Currently    Birth control/protection: None  Other Topics Concern   Not on file  Social History Narrative   Not on file   Social  Determinants of Health   Financial Resource Strain: Not on file  Food Insecurity: Not on file  Transportation Needs:  Not on file  Physical Activity: Not on file  Stress: Not on file  Social Connections: Not on file  Intimate Partner Violence: Not on file    FAMILY HISTORY: Family History  Problem Relation Age of Onset   Cancer Father     ALLERGIES:  has No Known Allergies.  MEDICATIONS:  Current Outpatient Medications  Medication Sig Dispense Refill   ACCU-CHEK GUIDE test strip      cholecalciferol (VITAMIN D) 1000 units tablet Take 1,000 Units by mouth daily.     cyanocobalamin (VITAMIN B12) 1000 MCG tablet Take 2 tablets daily for 2 weeks, then reduce to 1 tablet daily thereafter for Vitamin B12 Deficiency.     cyclobenzaprine (FLEXERIL) 5 MG tablet TAKE 1 TABLET BY MOUTH  TWICE DAILY AS NEEDED FOR  MUSCLE SPASMS FOR UP TO 10  DAYS     diltiazem (CARDIZEM CD) 240 MG 24 hr capsule Take by mouth.     Docusate Sodium (DSS) 100 MG CAPS Take 1 capsule by mouth 2 (two) times daily.     ferrous sulfate 325 (65 FE) MG tablet Take by mouth.     glipiZIDE (GLUCOTROL) 5 MG tablet TAKE 1 TABLET BY MOUTH IN THE  MORNING BEFORE BREAKFAST     glucose blood (PRECISION QID TEST) test strip 1 each (1 strip total) once daily And as needed     hydrochlorothiazide (HYDRODIURIL) 25 MG tablet Take by mouth.     insulin aspart (NOVOLOG) 100 UNIT/ML injection Inject into the skin.     levothyroxine (SYNTHROID, LEVOTHROID) 75 MCG tablet Take by mouth daily before breakfast.      losartan (COZAAR) 50 MG tablet Take by mouth.     lovastatin (MEVACOR) 20 MG tablet Take by mouth.     meclizine (ANTIVERT) 25 MG tablet TAKE 1 TABLET BY MOUTH 3  TIMES DAILY AS NEEDED FOR  DIZZINESS     meloxicam (MOBIC) 7.5 MG tablet Take 1 tablet by mouth daily.     metFORMIN (GLUCOPHAGE) 500 MG tablet Take by mouth 2 (two) times daily with a meal.     potassium chloride (KLOR-CON) 8 MEQ tablet Take by mouth.     torsemide  (DEMADEX) 5 MG tablet Take 5 mg by mouth daily.     traZODone (DESYREL) 50 MG tablet Take 50 mg by mouth at bedtime.     aspirin EC 81 MG tablet Take 81 mg by mouth daily. (Patient not taking: Reported on 08/24/2022)     No current facility-administered medications for this visit.    Review of Systems  Constitutional:  Positive for fatigue and unexpected weight change. Negative for appetite change, chills and fever.  HENT:   Negative for hearing loss and voice change.   Eyes:  Negative for eye problems.  Respiratory:  Negative for chest tightness and cough.   Cardiovascular:  Negative for chest pain.  Gastrointestinal:  Negative for abdominal distention, abdominal pain and blood in stool.  Endocrine: Negative for hot flashes.       Patient feels cold  Genitourinary:  Negative for difficulty urinating and frequency.   Musculoskeletal:  Negative for arthralgias.  Skin:  Negative for itching and rash.  Neurological:  Negative for extremity weakness.  Hematological:  Negative for adenopathy.  Psychiatric/Behavioral:  Negative for confusion.     PHYSICAL EXAMINATION: ECOG PERFORMANCE STATUS: 2 - Symptomatic, <50% confined to bed Vitals:   08/24/22 0937  BP: (!) 117/58  Pulse: 74  Resp: 20  Temp: 98.7 F (  37.1 C)  SpO2: 98%   Filed Weights   08/24/22 0937  Weight: 176 lb 4.8 oz (80 kg)    Physical Exam Constitutional:      General: She is not in acute distress.    Comments: Elderly frail female sits in the wheelchair  HENT:     Head: Normocephalic and atraumatic.  Eyes:     General: No scleral icterus. Cardiovascular:     Rate and Rhythm: Normal rate.  Pulmonary:     Effort: Pulmonary effort is normal. No respiratory distress.     Breath sounds: No wheezing.  Abdominal:     General: There is no distension.     Palpations: Abdomen is soft.  Musculoskeletal:        General: No deformity. Normal range of motion.     Cervical back: Normal range of motion and neck  supple.  Skin:    General: Skin is warm and dry.     Findings: No erythema or rash.  Neurological:     Mental Status: She is alert. Mental status is at baseline.  Psychiatric:        Mood and Affect: Mood normal.      LABORATORY DATA:  I have reviewed the data as listed    Latest Ref Rng & Units 08/24/2022   10:22 AM 04/01/2013   12:50 AM  CBC  WBC 4.0 - 10.5 K/uL 3.5  7.3   Hemoglobin 12.0 - 15.0 g/dL 9.4  12.6   Hematocrit 36.0 - 46.0 % 30.3  39.0   Platelets 150 - 400 K/uL 142  102       Latest Ref Rng & Units 08/24/2022   10:22 AM 04/01/2013   12:50 AM  CMP  Glucose 70 - 99 mg/dL 161  139   BUN 8 - 23 mg/dL 28  24   Creatinine 0.44 - 1.00 mg/dL 1.40  0.94   Sodium 135 - 145 mmol/L 137  140   Potassium 3.5 - 5.1 mmol/L 3.2  3.3   Chloride 98 - 111 mmol/L 102  103   CO2 22 - 32 mmol/L 28  33   Calcium 8.9 - 10.3 mg/dL 8.9  8.5   Total Protein 6.5 - 8.1 g/dL 7.3  7.7   Total Bilirubin 0.3 - 1.2 mg/dL 0.2  0.5   Alkaline Phos 38 - 126 U/L 58  83   AST 15 - 41 U/L 19  24   ALT 0 - 44 U/L 11  24    No results found for: "IRON", "TIBC", "FERRITIN", "IRONPCTSAT"   RADIOGRAPHIC STUDIES: I have personally reviewed the radiological images as listed and agreed with the findings in the report. No results found.

## 2022-08-25 LAB — KAPPA/LAMBDA LIGHT CHAINS
Kappa free light chain: 45.9 mg/L — ABNORMAL HIGH (ref 3.3–19.4)
Kappa, lambda light chain ratio: 1.67 — ABNORMAL HIGH (ref 0.26–1.65)
Lambda free light chains: 27.5 mg/L — ABNORMAL HIGH (ref 5.7–26.3)

## 2022-08-26 ENCOUNTER — Encounter: Payer: Self-pay | Admitting: Oncology

## 2022-08-26 LAB — COMP PANEL: LEUKEMIA/LYMPHOMA

## 2022-08-26 NOTE — Telephone Encounter (Signed)
Please advise 

## 2022-08-31 LAB — MULTIPLE MYELOMA PANEL, SERUM
Albumin SerPl Elph-Mcnc: 3.3 g/dL (ref 2.9–4.4)
Albumin/Glob SerPl: 1 (ref 0.7–1.7)
Alpha 1: 0.2 g/dL (ref 0.0–0.4)
Alpha2 Glob SerPl Elph-Mcnc: 0.9 g/dL (ref 0.4–1.0)
B-Globulin SerPl Elph-Mcnc: 0.9 g/dL (ref 0.7–1.3)
Gamma Glob SerPl Elph-Mcnc: 1.4 g/dL (ref 0.4–1.8)
Globulin, Total: 3.4 g/dL (ref 2.2–3.9)
IgA: 228 mg/dL (ref 64–422)
IgG (Immunoglobin G), Serum: 1421 mg/dL (ref 586–1602)
IgM (Immunoglobulin M), Srm: 29 mg/dL (ref 26–217)
M Protein SerPl Elph-Mcnc: 0.9 g/dL — ABNORMAL HIGH
Total Protein ELP: 6.7 g/dL (ref 6.0–8.5)

## 2022-09-07 ENCOUNTER — Inpatient Hospital Stay: Payer: Medicare Other | Attending: Oncology | Admitting: Oncology

## 2022-09-07 ENCOUNTER — Encounter: Payer: Self-pay | Admitting: Oncology

## 2022-09-07 VITALS — BP 126/73 | HR 93 | Temp 97.8°F | Resp 18

## 2022-09-07 DIAGNOSIS — D472 Monoclonal gammopathy: Secondary | ICD-10-CM | POA: Insufficient documentation

## 2022-09-07 DIAGNOSIS — D638 Anemia in other chronic diseases classified elsewhere: Secondary | ICD-10-CM | POA: Insufficient documentation

## 2022-09-07 DIAGNOSIS — N1832 Chronic kidney disease, stage 3b: Secondary | ICD-10-CM | POA: Diagnosis not present

## 2022-09-07 DIAGNOSIS — D631 Anemia in chronic kidney disease: Secondary | ICD-10-CM

## 2022-09-07 DIAGNOSIS — D696 Thrombocytopenia, unspecified: Secondary | ICD-10-CM | POA: Insufficient documentation

## 2022-09-07 DIAGNOSIS — R5383 Other fatigue: Secondary | ICD-10-CM | POA: Insufficient documentation

## 2022-09-07 DIAGNOSIS — R634 Abnormal weight loss: Secondary | ICD-10-CM | POA: Insufficient documentation

## 2022-09-07 MED ORDER — FERROUS SULFATE 325 (65 FE) MG PO TABS: 325.0000 mg | ORAL_TABLET | Freq: Two times a day (BID) | ORAL | 2 refills | Status: AC

## 2022-09-07 NOTE — Assessment & Plan Note (Signed)
Labs are reviewed and discussed with patient. Hemoglobin 9.3, iron panel showed saturation of 18, ferritin 23.  Creatinine 1.4  Discussed about iron supplementation to further increase iron level She is already on iron supplementation twice daily with vitamin C supplementation.  Increasing to 3 times daily is an option however I doubt effectiveness.  Discussed with patient about option of IV Venofer treatments. I discussed about the potential risks including but not limited to allergic reactions/infusion reactions including anaphylactic reactions, phlebitis, high blood pressure, wheezing, SOB, skin rash, weight gain, leg swelling, headache, nausea and fatigue, etc. for now patient and son would like to continue iron supplementation.  I sent patient a prescription of ferrous sulfate 325 mg twice daily with meals.  Continue over-the-counter vitamin C supplementation.  They will call office if she decides to proceed with iron infusion.

## 2022-09-07 NOTE — Assessment & Plan Note (Signed)
Very mild thrombocytopenia.  Observation.

## 2022-09-07 NOTE — Progress Notes (Signed)
Patient here for follow up and and lab results

## 2022-09-07 NOTE — Progress Notes (Signed)
Hematology/Oncology Progress note [phone] [phone]         Patient Care Team: Mackey Birchwood, MD as PCP - General (Internal Medicine)   REFERRING PROVIDER: Mackey Birchwood, MD  CHIEF COMPLAINTS/REASON FOR VISIT:  Anemia of chronic disease, MGUS  ASSESSMENT & PLAN:  Anemia in chronic kidney disease (CKD) Labs are reviewed and discussed with patient. Hemoglobin 9.3, iron panel showed saturation of 18, ferritin 23.  Creatinine 1.4  Discussed about iron supplementation to further increase iron level She is already on iron supplementation twice daily with vitamin C supplementation.  Increasing to 3 times daily is an option however I doubt effectiveness.  Discussed with patient about option of IV Venofer treatments. I discussed about the potential risks including but not limited to allergic reactions/infusion reactions including anaphylactic reactions, phlebitis, high blood pressure, wheezing, SOB, skin rash, weight gain, leg swelling, headache, nausea and fatigue, etc. for now patient and son would like to continue iron supplementation.  I sent patient a prescription of ferrous sulfate 325 mg twice daily with meals.  Continue over-the-counter vitamin C supplementation.  They will call office if she decides to proceed with iron infusion.  Thrombocytopenia ([place]) Very mild thrombocytopenia.  Observation.  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 I discussed with patient about the diagnosis of IgG MGUS which is an asymptomatic condition which has risk of progression to smoldering multiple myeloma and to symptomatic multiple myeloma. Less frequently, these patients progress to AL amyloidosis, light chain deposition disease, or another lymphoproliferative disorder. Bone marrow biopsy can be considered if M protein progressively increases.  Patient and son are not interested in aggressive diagnostic work-up  including bone marrow biopsy. For now I recommend observation given her age and other medical conditions.. Check SPEP and light chain ratio in 4 months.  Orders Placed This Encounter  Procedures   CBC with Differential/Platelet    Standing Status:   Future    Standing Expiration Date:   09/08/2023   Comprehensive metabolic panel    Standing Status:   Future    Standing Expiration Date:   09/07/2023   Kappa/lambda light chains    Standing Status:   Future    Standing Expiration Date:   09/07/2023   Multiple Myeloma Panel (SPEP&IFE w/QIG)    Standing Status:   Future    Standing Expiration Date:   09/07/2023   Iron and TIBC    Standing Status:   Future    Standing Expiration Date:   09/08/2023   Ferritin    Standing Status:   Future    Standing Expiration Date:   09/08/2023   Follow-up in 4 months. All questions were answered. The patient knows to call the clinic with any problems, questions or concerns.  Earlie Server, MD, PhD [facility] Hematology Oncology 09/07/2022     HISTORY OF PRESENTING ILLNESS:  Lauren Riddle is a  88 y.o.  female with PMH listed below who was referred to me for anemia Reviewed patient's recent labs that was done.  She was found to have abnormal CBC on 08/03/2022 hemoglobin 9.3, hematocrit 29.8, MCV 76.6.  Patient is chronically microcytic.  She has had better hemoglobin levels in the past. Reviewed patient's previous labs ordered by primary care physician's office, anemia is chronic onset  She denies recent chest pain on exertion, shortness of breath on minimal exertion, pre-syncopal episodes, or palpitations She had not noticed any recent bleeding such as epistaxis, hematuria  or hematochezia. + Dark stool.  Patient takes oral iron supplementation. She takes Mobic daily.  She has noticed some gas/bloating.  No abdominal pain Her last colonoscopy was  She denies any pica and eats a variety of diet.  Appetite is not great.  She noticed that she has lost  couple of pounds.  Patient was accompanied by her son Lauren Riddle.   INTERVAL HISTORY Lauren Riddle is a 88 y.o. female who has above history reviewed by me today presents for follow up visit to review results. She has no new complaints.  Continues to feel cold.  She takes iron supplementation twice daily with vitamin C.  Accompanied by son.   MEDICAL HISTORY:  Past Medical History:  Diagnosis Date   Arthritis    knees   Cough    chronic   Depression    Diabetes mellitus without complication (HCC)    HOH (hard of hearing)    Hypertension    controlled on meds   Pneumonia    in past   Seasonal allergies    Sleep apnea    per H&P, does not use CPAP   Thyroid disease    Vertigo    Wears dentures    full upper, partial lower    SURGICAL HISTORY: Past Surgical History:  Procedure Laterality Date   ABDOMINAL HYSTERECTOMY     CATARACT EXTRACTION W/PHACO Right 04/20/2017   Procedure: CATARACT EXTRACTION PHACO AND INTRAOCULAR LENS PLACEMENT ([place]) Right Diabetic;  Surgeon: Leandrew Koyanagi, MD;  Location: [place];  Service: Ophthalmology;  Laterality: Right;  diabetic-oral med and history of sleep apnea, no CPAP   CATARACT EXTRACTION W/PHACO Left 04/05/2018   Procedure: CATARACT EXTRACTION PHACO AND INTRAOCULAR LENS PLACEMENT ([place]) LEFT DIABETIC;  Surgeon: Leandrew Koyanagi, MD;  Location: [place];  Service: Ophthalmology;  Laterality: Left;  Diabetic - oral meds   CHOLECYSTECTOMY     COLONOSCOPY     TUBAL LIGATION      SOCIAL HISTORY: Social History   Socioeconomic History   Marital status: Widowed    Spouse name: Not on file   Number of children: Not on file   Years of education: Not on file   Highest education level: Not on file  Occupational History   Not on file  Tobacco Use   Smoking status: Never   Smokeless tobacco: Current    Types: Snuff  Vaping Use   Vaping Use: Never used  Substance and Sexual Activity   Alcohol use: No     Alcohol/week: 0.0 standard drinks of alcohol   Drug use: No   Sexual activity: Not Currently    Birth control/protection: None  Other Topics Concern   Not on file  Social History Narrative   Not on file   Social Determinants of Health   Financial Resource Strain: Not on file  Food Insecurity: Not on file  Transportation Needs: Not on file  Physical Activity: Not on file  Stress: Not on file  Social Connections: Not on file  Intimate Partner Violence: Not on file    FAMILY HISTORY: Family History  Problem Relation Age of Onset   Cancer Father     ALLERGIES:  has No Known Allergies.  MEDICATIONS:  Current Outpatient Medications  Medication Sig Dispense Refill   ACCU-CHEK GUIDE test strip      cholecalciferol (VITAMIN D) 1000 units tablet Take 1,000 Units by mouth daily.     cyanocobalamin (VITAMIN B12) 1000 MCG tablet Take 2 tablets daily for  2 weeks, then reduce to 1 tablet daily thereafter for Vitamin B12 Deficiency.     cyclobenzaprine (FLEXERIL) 5 MG tablet TAKE 1 TABLET BY MOUTH  TWICE DAILY AS NEEDED FOR  MUSCLE SPASMS FOR UP TO 10  DAYS     diltiazem (CARDIZEM CD) 240 MG 24 hr capsule Take by mouth.     Docusate Sodium (DSS) 100 MG CAPS Take 1 capsule by mouth 2 (two) times daily.     glipiZIDE (GLUCOTROL) 5 MG tablet TAKE 1 TABLET BY MOUTH IN THE  MORNING BEFORE BREAKFAST     glucose blood (PRECISION QID TEST) test strip 1 each (1 strip total) once daily And as needed     hydrochlorothiazide (HYDRODIURIL) 25 MG tablet Take by mouth.     levothyroxine (SYNTHROID, LEVOTHROID) 75 MCG tablet Take by mouth daily before breakfast.      losartan (COZAAR) 50 MG tablet Take by mouth.     lovastatin (MEVACOR) 20 MG tablet Take by mouth.     meclizine (ANTIVERT) 25 MG tablet TAKE 1 TABLET BY MOUTH 3  TIMES DAILY AS NEEDED FOR  DIZZINESS     meloxicam (MOBIC) 7.5 MG tablet Take 1 tablet by mouth daily.     metFORMIN (GLUCOPHAGE) 500 MG tablet Take by mouth 2 (two) times daily  with a meal.     potassium chloride (KLOR-CON) 8 MEQ tablet Take by mouth.     torsemide (DEMADEX) 5 MG tablet Take 5 mg by mouth daily.     traZODone (DESYREL) 50 MG tablet Take 50 mg by mouth at bedtime.     aspirin EC 81 MG tablet Take 81 mg by mouth daily. (Patient not taking: Reported on 08/24/2022)     ferrous sulfate 325 (65 FE) MG tablet Take 1 tablet (325 mg total) by mouth 2 (two) times daily with a meal. 180 tablet 2   insulin aspart (NOVOLOG) 100 UNIT/ML injection Inject into the skin. (Patient not taking: Reported on 09/07/2022)     No current facility-administered medications for this visit.    Review of Systems  Constitutional:  Positive for fatigue and unexpected weight change. Negative for appetite change, chills and fever.  HENT:   Negative for hearing loss and voice change.   Eyes:  Negative for eye problems.  Respiratory:  Negative for chest tightness and cough.   Cardiovascular:  Negative for chest pain.  Gastrointestinal:  Negative for abdominal distention, abdominal pain and blood in stool.  Endocrine: Negative for hot flashes.       Patient feels cold  Genitourinary:  Negative for difficulty urinating and frequency.   Musculoskeletal:  Negative for arthralgias.  Skin:  Negative for itching and rash.  Neurological:  Negative for extremity weakness.  Hematological:  Negative for adenopathy.  Psychiatric/Behavioral:  Negative for confusion.     PHYSICAL EXAMINATION: ECOG PERFORMANCE STATUS: 2 - Symptomatic, <50% confined to bed Vitals:   09/07/22 1308  BP: 126/73  Pulse: 93  Resp: 18  Temp: 97.8 F (36.6 C)   Filed Weights    Physical Exam Constitutional:      General: She is not in acute distress.    Comments: Elderly frail female sits in the wheelchair  HENT:     Head: Normocephalic and atraumatic.  Eyes:     General: No scleral icterus. Cardiovascular:     Rate and Rhythm: Normal rate.  Pulmonary:     Effort: Pulmonary effort is normal. No  respiratory distress.  Abdominal:  General: There is no distension.  Musculoskeletal:     Cervical back: Normal range of motion.  Skin:    Findings: No rash.  Neurological:     Mental Status: She is alert. Mental status is at baseline.  Psychiatric:        Mood and Affect: Mood normal.      LABORATORY DATA:  I have reviewed the data as listed    Latest Ref Rng & Units 08/24/2022   10:22 AM 04/01/2013   12:50 AM  CBC  WBC 4.0 - 10.5 K/uL 3.5  7.3   Hemoglobin 12.0 - 15.0 g/dL 9.4  12.6   Hematocrit 36.0 - 46.0 % 30.3  39.0   Platelets 150 - 400 K/uL 142  102       Latest Ref Rng & Units 08/24/2022   10:22 AM 04/01/2013   12:50 AM  CMP  Glucose 70 - 99 mg/dL 161  139   BUN 8 - 23 mg/dL 28  24   Creatinine 0.44 - 1.00 mg/dL 1.40  0.94   Sodium 135 - 145 mmol/L 137  140   Potassium 3.5 - 5.1 mmol/L 3.2  3.3   Chloride 98 - 111 mmol/L 102  103   CO2 22 - 32 mmol/L 28  33   Calcium 8.9 - 10.3 mg/dL 8.9  8.5   Total Protein 6.5 - 8.1 g/dL 7.3  7.7   Total Bilirubin 0.3 - 1.2 mg/dL 0.2  0.5   Alkaline Phos 38 - 126 U/L 58  83   AST 15 - 41 U/L 19  24   ALT 0 - 44 U/L 11  24       Component Value Date/Time   IRON 62 08/24/2022 1022   TIBC 342 08/24/2022 1022   FERRITIN 23 08/24/2022 1022   IRONPCTSAT 18 08/24/2022 1022     RADIOGRAPHIC STUDIES: I have personally reviewed the radiological images as listed and agreed with the findings in the report. No results found.

## 2022-09-07 NOTE — Assessment & Plan Note (Addendum)
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 I discussed with patient about the diagnosis of IgG MGUS which is an asymptomatic condition which has risk of progression to smoldering multiple myeloma and to symptomatic multiple myeloma. Less frequently, these patients progress to AL amyloidosis, light chain deposition disease, or another lymphoproliferative disorder. Bone marrow biopsy can be considered if M protein progressively increases.  Patient and son are not interested in aggressive diagnostic work-up including bone marrow biopsy. For now I recommend observation given her age and other medical conditions.. Check SPEP and light chain ratio in 4 months.

## 2022-09-21 ENCOUNTER — Ambulatory Visit: Payer: Medicare Other | Admitting: Oncology

## 2022-09-27 ENCOUNTER — Ambulatory Visit: Payer: Medicare Other | Admitting: Oncology

## 2022-09-28 ENCOUNTER — Ambulatory Visit: Payer: Medicare Other | Admitting: Oncology

## 2022-10-07 ENCOUNTER — Encounter: Payer: Self-pay | Admitting: Ophthalmology

## 2022-10-11 ENCOUNTER — Encounter: Payer: Self-pay | Admitting: Anesthesiology

## 2022-10-12 ENCOUNTER — Other Ambulatory Visit
Admission: RE | Admit: 2022-10-12 | Discharge: 2022-10-12 | Disposition: A | Discharge status: Home / Self Care | Payer: Medicare Other | Attending: Urgent Care | Admitting: Urgent Care

## 2022-10-12 DIAGNOSIS — D472 Monoclonal gammopathy: Secondary | ICD-10-CM | POA: Diagnosis present

## 2022-10-12 DIAGNOSIS — N1832 Chronic kidney disease, stage 3b: Secondary | ICD-10-CM | POA: Insufficient documentation

## 2022-10-12 DIAGNOSIS — D631 Anemia in chronic kidney disease: Secondary | ICD-10-CM | POA: Insufficient documentation

## 2022-10-12 LAB — COMPREHENSIVE METABOLIC PANEL
ALT: 10 U/L (ref 0–44)
AST: 14 U/L — ABNORMAL LOW (ref 15–41)
Albumin: 3.7 g/dL (ref 3.5–5.0)
Alkaline Phosphatase: 58 U/L (ref 38–126)
Anion gap: 7 (ref 5–15)
BUN: 23 mg/dL (ref 8–23)
CO2: 30 mmol/L (ref 22–32)
Calcium: 8.8 mg/dL — ABNORMAL LOW (ref 8.9–10.3)
Chloride: 104 mmol/L (ref 98–111)
Creatinine, Ser: 1.17 mg/dL — ABNORMAL HIGH (ref 0.44–1.00)
GFR, Estimated: 43 mL/min — ABNORMAL LOW (ref >60)
Glucose, Bld: 125 mg/dL — ABNORMAL HIGH (ref 70–99)
Potassium: 3.7 mmol/L (ref 3.5–5.1)
Sodium: 141 mmol/L (ref 135–145)
Total Bilirubin: <0.1 mg/dL — ABNORMAL LOW (ref 0.3–1.2)
Total Protein: 7.4 g/dL (ref 6.5–8.1)

## 2022-10-12 LAB — CBC WITH DIFFERENTIAL/PLATELET
Abs Immature Granulocytes: 0.01 10*3/uL (ref 0.00–0.07)
Basophils Absolute: 0 10*3/uL (ref 0.0–0.1)
Basophils Relative: 0 %
Eosinophils Absolute: 0.1 10*3/uL (ref 0.0–0.5)
Eosinophils Relative: 2 %
HCT: 29.1 % — ABNORMAL LOW (ref 36.0–46.0)
Hemoglobin: 8.9 g/dL — ABNORMAL LOW (ref 12.0–15.0)
Immature Granulocytes: 0 %
Lymphocytes Relative: 17 %
Lymphs Abs: 0.8 10*3/uL (ref 0.7–4.0)
MCH: 23.9 pg — ABNORMAL LOW (ref 26.0–34.0)
MCHC: 30.6 g/dL (ref 30.0–36.0)
MCV: 78.2 fL — ABNORMAL LOW (ref 80.0–100.0)
Monocytes Absolute: 0.4 10*3/uL (ref 0.1–1.0)
Monocytes Relative: 8 %
Neutro Abs: 3.5 10*3/uL (ref 1.7–7.7)
Neutrophils Relative %: 73 %
Platelets: 161 10*3/uL (ref 150–400)
RBC: 3.72 MIL/uL — ABNORMAL LOW (ref 3.87–5.11)
RDW: 16.6 % — ABNORMAL HIGH (ref 11.5–15.5)
WBC: 4.8 10*3/uL (ref 4.0–10.5)
nRBC: 0 % (ref 0.0–0.2)

## 2022-10-12 LAB — IRON AND TIBC
Iron: 85 ug/dL (ref 28–170)
Saturation Ratios: 25 % (ref 10.4–31.8)
TIBC: 337 ug/dL (ref 250–450)
UIBC: 252 ug/dL

## 2022-10-12 LAB — FERRITIN: Ferritin: 19 ng/mL (ref 11–307)

## 2022-10-13 LAB — KAPPA/LAMBDA LIGHT CHAINS
Kappa free light chain: 52.1 mg/L — ABNORMAL HIGH (ref 3.3–19.4)
Kappa, lambda light chain ratio: 1.67 — ABNORMAL HIGH (ref 0.26–1.65)
Lambda free light chains: 31.2 mg/L — ABNORMAL HIGH (ref 5.7–26.3)

## 2022-10-14 NOTE — Discharge Instructions (Signed)

## 2022-10-15 ENCOUNTER — Ambulatory Visit: Admission: RE | Admit: 2022-10-15 | Payer: Medicare Other | Source: Home / Self Care | Admitting: Ophthalmology

## 2022-10-15 DIAGNOSIS — Z01818 Encounter for other preprocedural examination: Secondary | ICD-10-CM

## 2022-10-15 HISTORY — DX: Hypothyroidism, unspecified: E03.9

## 2022-10-15 SURGERY — REPAIR, ENTROPION
Anesthesia: Monitor Anesthesia Care | Laterality: Right

## 2022-10-18 LAB — MULTIPLE MYELOMA PANEL, SERUM
Albumin SerPl Elph-Mcnc: 3.2 g/dL (ref 2.9–4.4)
Albumin/Glob SerPl: 1.1 (ref 0.7–1.7)
Alpha 1: 0.2 g/dL (ref 0.0–0.4)
Alpha2 Glob SerPl Elph-Mcnc: 0.9 g/dL (ref 0.4–1.0)
B-Globulin SerPl Elph-Mcnc: 0.9 g/dL (ref 0.7–1.3)
Gamma Glob SerPl Elph-Mcnc: 1.2 g/dL (ref 0.4–1.8)
Globulin, Total: 3.2 g/dL (ref 2.2–3.9)
IgA: 213 mg/dL (ref 64–422)
IgG (Immunoglobin G), Serum: 1326 mg/dL (ref 586–1602)
IgM (Immunoglobulin M), Srm: 30 mg/dL (ref 26–217)
M Protein SerPl Elph-Mcnc: 0.6 g/dL — ABNORMAL HIGH
Total Protein ELP: 6.4 g/dL (ref 6.0–8.5)

## 2022-11-14 IMAGING — CT CT ABD-PELV W/O CM
2 of 4 series · 15 of 46 positions shown, 17 images · non-contrast
Comparison: None Available.

CLINICAL DATA: Intermittent abdominal pain



[Series 2: routine abd/pel wo · axial · 0.69mm/px · z∈[-516,-141]mm · 12 of 89 slices shown, 14 images]
[im 7/89  soft-tissue]
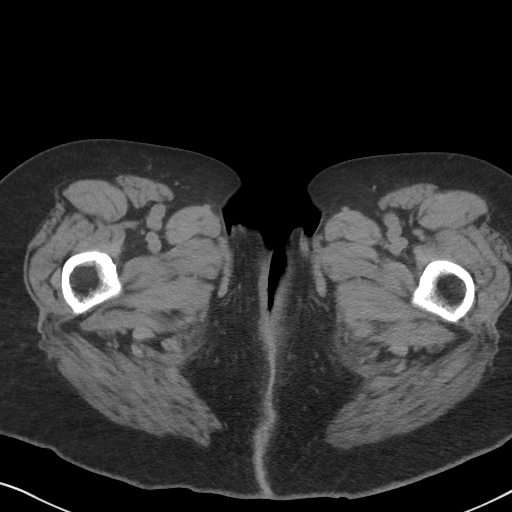
[im 7/89  bone]
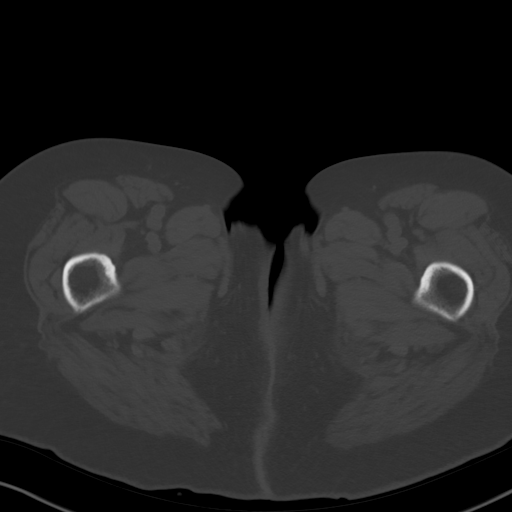
[im 14/89  soft-tissue]
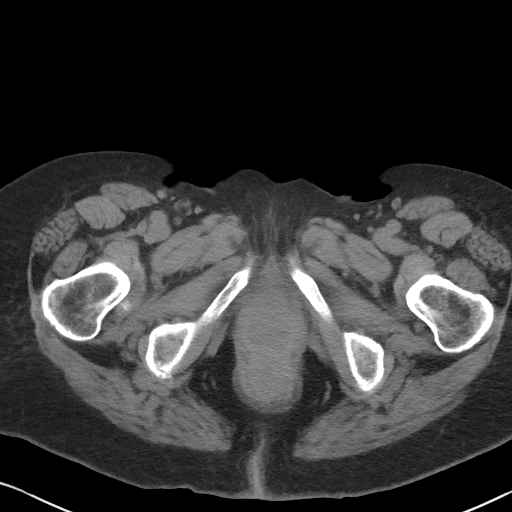
[im 21/89  soft-tissue]
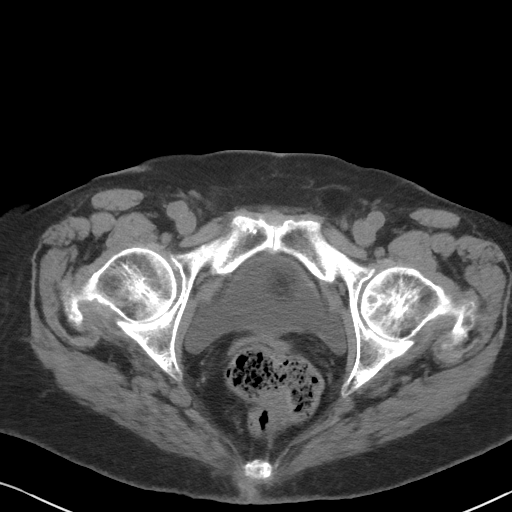
[im 28/89  soft-tissue]
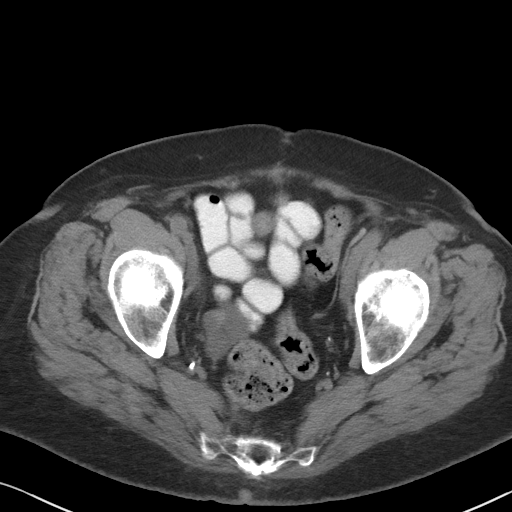
[im 34/89  soft-tissue]
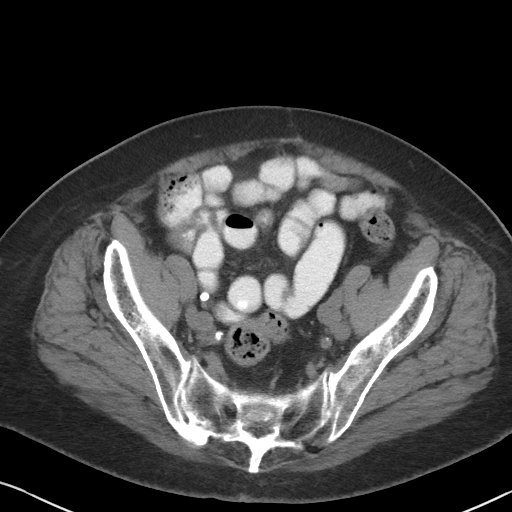
[im 41/89  soft-tissue]
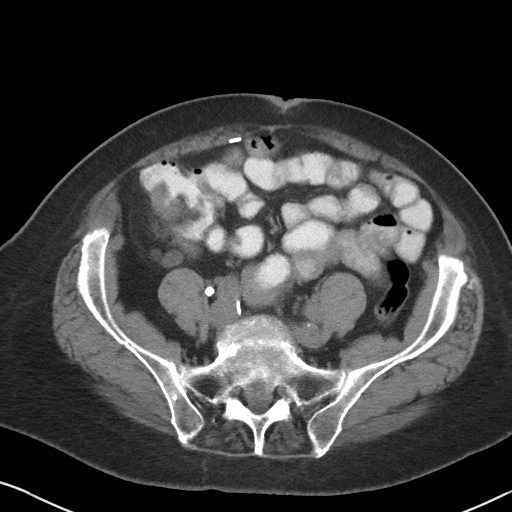
[im 48/89  soft-tissue]
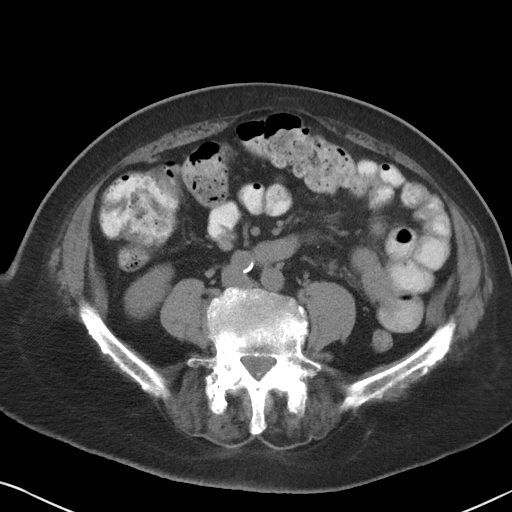
[im 55/89  soft-tissue]
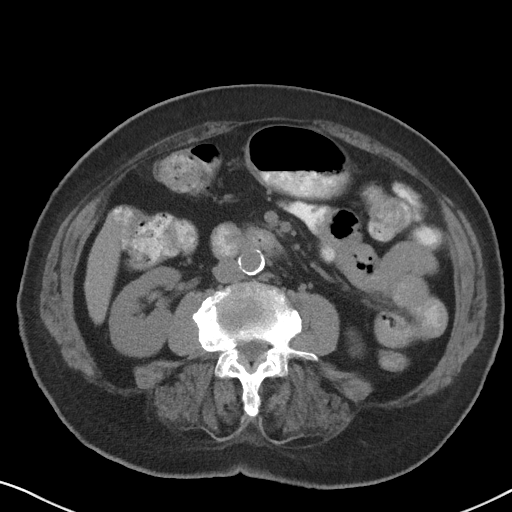
[im 61/89  soft-tissue]
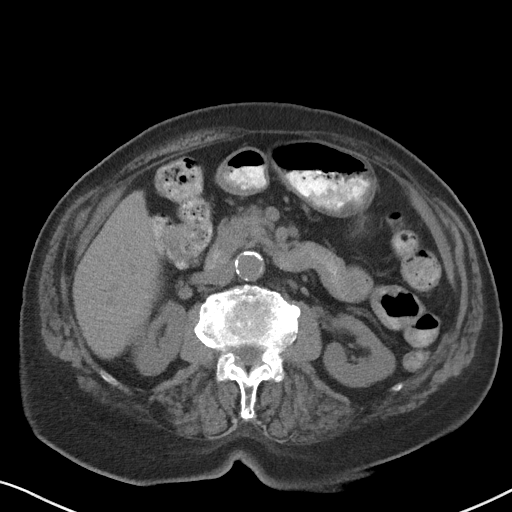
[im 61/89  bone]
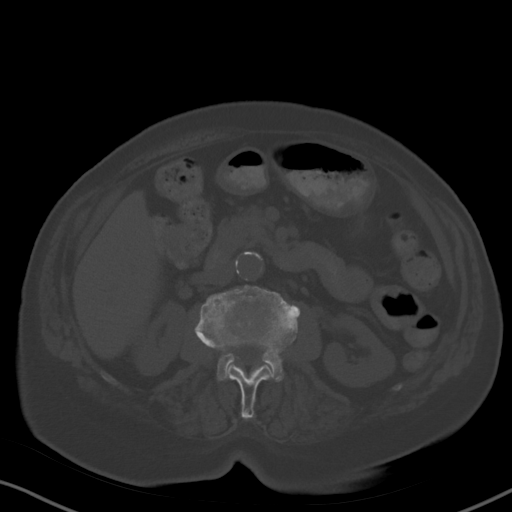
[im 68/89  soft-tissue]
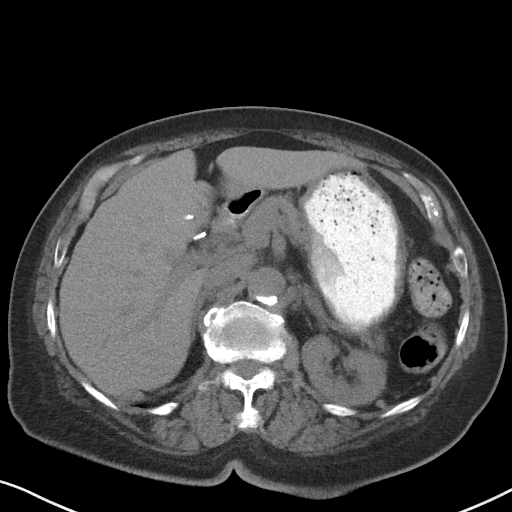
[im 75/89  soft-tissue]
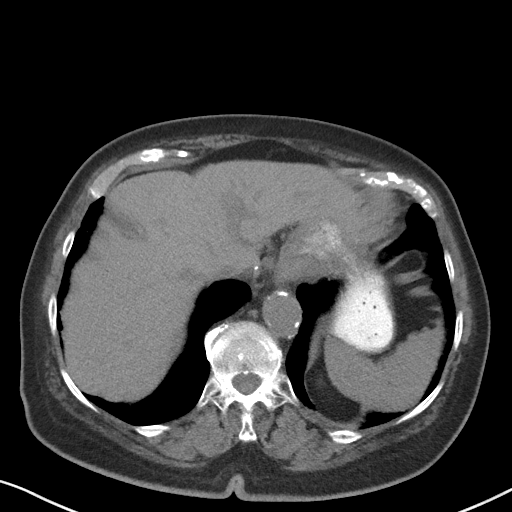
[im 82/89  soft-tissue]
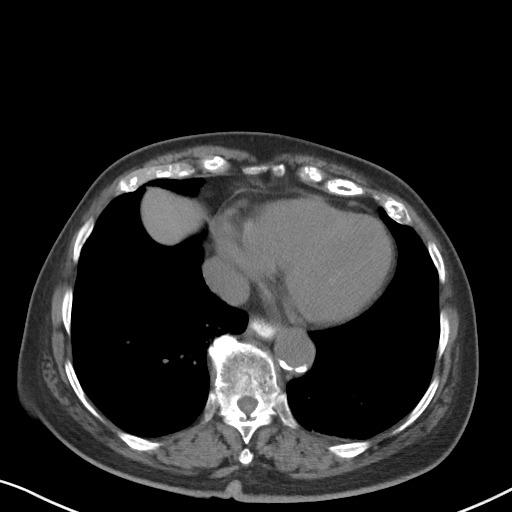

[Series 5: coronal st · coronal · 0.73mm/px · 3 of 97 slices shown]
[im 33/97  soft-tissue]
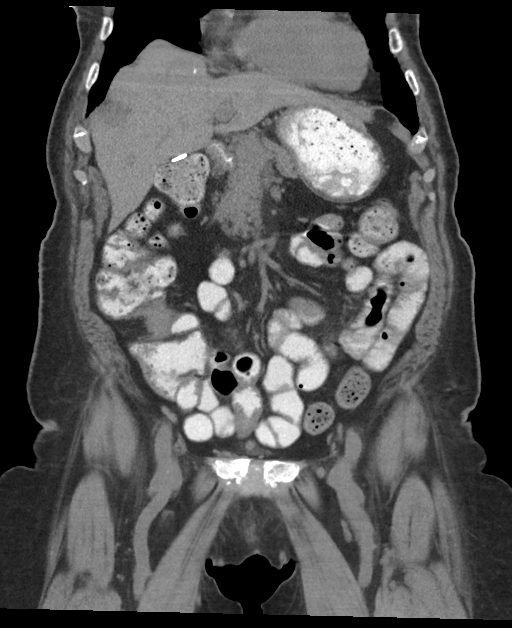
[im 43/97  soft-tissue]
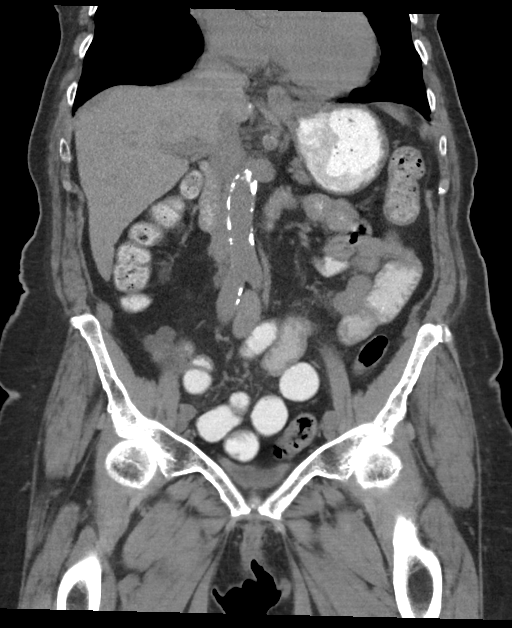
[im 54/97  soft-tissue]
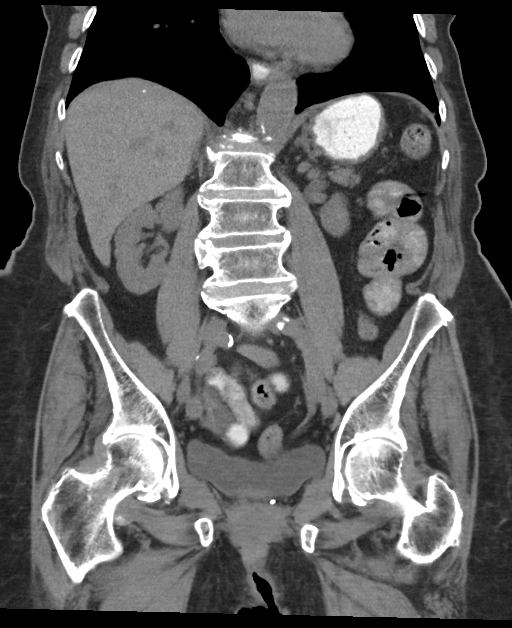

[15 of 46 positions shown; findings below may reference images not displayed]

FINDINGS: Lower chest: Heart is enlarged in size. No focal infiltrates are
seen in the lower lung fields.

Hepatobiliary: There are few scattered small calcifications possibly
granulomas. There is no dilation of bile ducts. Surgical clips are
seen in gallbladder fossa.

Pancreas: No focal abnormality is seen.

Spleen: Unremarkable.

Adrenals/Urinary Tract: Adrenals are not enlarged. There is no
hydronephrosis. There are no renal or ureteral stones. Urinary
bladder is unremarkable.

Stomach/Bowel: Stomach is unremarkable. There is contrast in the
lumen of lower thoracic esophagus. This may be due to delayed
emptying of the esophagus or gastroesophageal reflux. Stomach is
unremarkable. Small bowel loops are unremarkable. Appendix is not
seen. Oral contrast has reached the right colon ruling out any
significant small bowel obstruction. Small smooth marginated nodular
densities in the pericecal region may suggest reactive hyperplasia
of lymph nodes. There is no significant wall thickening in colon.
There is no pericolic stranding.

Vascular/Lymphatic: Scattered arterial calcifications are seen.

Reproductive: Uterus is not seen. There is 4.5 x 3 cm fluid density
lesion in the right adnexa. There are no demonstrable thick internal
septations.

Other: There is no ascites or pneumoperitoneum.

Musculoskeletal: There is minimal anterolisthesis at L4-L5 and L5-S1
levels. Spinal stenosis and encroachment of neural foramina is seen
at multiple levels more so at L4-L5 level.
IMPRESSION: There is no evidence intestinal obstruction or pneumoperitoneum.
There is no hydronephrosis.

There is 4.5 cm smooth marginated fluid density lesion in the right
adnexa possibly right ovarian cyst or enteric duplication cyst.
Follow-up pelvic sonogram in 6 months may be considered.

Lumbar spondylosis. Other findings as described in the body of the
report.

## 2022-12-02 DIAGNOSIS — J431 Panlobular emphysema: Secondary | ICD-10-CM | POA: Insufficient documentation

## 2023-01-06 ENCOUNTER — Other Ambulatory Visit: Payer: Self-pay

## 2023-01-06 DIAGNOSIS — D472 Monoclonal gammopathy: Secondary | ICD-10-CM

## 2023-01-07 ENCOUNTER — Inpatient Hospital Stay: Payer: Medicare Other | Attending: Oncology

## 2023-01-07 ENCOUNTER — Telehealth: Payer: Self-pay

## 2023-01-07 DIAGNOSIS — D696 Thrombocytopenia, unspecified: Secondary | ICD-10-CM | POA: Diagnosis not present

## 2023-01-07 DIAGNOSIS — R140 Abdominal distension (gaseous): Secondary | ICD-10-CM | POA: Diagnosis not present

## 2023-01-07 DIAGNOSIS — R5383 Other fatigue: Secondary | ICD-10-CM | POA: Insufficient documentation

## 2023-01-07 DIAGNOSIS — D638 Anemia in other chronic diseases classified elsewhere: Secondary | ICD-10-CM | POA: Insufficient documentation

## 2023-01-07 DIAGNOSIS — R634 Abnormal weight loss: Secondary | ICD-10-CM | POA: Insufficient documentation

## 2023-01-07 DIAGNOSIS — D472 Monoclonal gammopathy: Secondary | ICD-10-CM | POA: Insufficient documentation

## 2023-01-07 DIAGNOSIS — K921 Melena: Secondary | ICD-10-CM | POA: Insufficient documentation

## 2023-01-07 LAB — CBC WITH DIFFERENTIAL/PLATELET
Abs Immature Granulocytes: 0.01 10*3/uL (ref 0.00–0.07)
Basophils Absolute: 0 10*3/uL (ref 0.0–0.1)
Basophils Relative: 0 %
Eosinophils Absolute: 0.1 10*3/uL (ref 0.0–0.5)
Eosinophils Relative: 3 %
HCT: 28.1 % — ABNORMAL LOW (ref 36.0–46.0)
Hemoglobin: 8.8 g/dL — ABNORMAL LOW (ref 12.0–15.0)
Immature Granulocytes: 0 %
Lymphocytes Relative: 22 %
Lymphs Abs: 0.8 10*3/uL (ref 0.7–4.0)
MCH: 24.4 pg — ABNORMAL LOW (ref 26.0–34.0)
MCHC: 31.3 g/dL (ref 30.0–36.0)
MCV: 78.1 fL — ABNORMAL LOW (ref 80.0–100.0)
Monocytes Absolute: 0.3 10*3/uL (ref 0.1–1.0)
Monocytes Relative: 10 %
Neutro Abs: 2.2 10*3/uL (ref 1.7–7.7)
Neutrophils Relative %: 65 %
Platelets: 141 10*3/uL — ABNORMAL LOW (ref 150–400)
RBC: 3.6 MIL/uL — ABNORMAL LOW (ref 3.87–5.11)
RDW: 15.3 % (ref 11.5–15.5)
WBC: 3.4 10*3/uL — ABNORMAL LOW (ref 4.0–10.5)
nRBC: 0 % (ref 0.0–0.2)

## 2023-01-07 LAB — IRON AND TIBC
Iron: 38 ug/dL (ref 28–170)
Saturation Ratios: 12 % (ref 10.4–31.8)
TIBC: 329 ug/dL (ref 250–450)
UIBC: 291 ug/dL

## 2023-01-07 LAB — COMPREHENSIVE METABOLIC PANEL
ALT: 10 U/L (ref 0–44)
AST: 18 U/L (ref 15–41)
Albumin: 3.4 g/dL — ABNORMAL LOW (ref 3.5–5.0)
Alkaline Phosphatase: 54 U/L (ref 38–126)
Anion gap: 10 (ref 5–15)
BUN: 22 mg/dL (ref 8–23)
CO2: 28 mmol/L (ref 22–32)
Calcium: 8.8 mg/dL — ABNORMAL LOW (ref 8.9–10.3)
Chloride: 103 mmol/L (ref 98–111)
Creatinine, Ser: 1.23 mg/dL — ABNORMAL HIGH (ref 0.44–1.00)
GFR, Estimated: 41 mL/min — ABNORMAL LOW (ref >60)
Glucose, Bld: 168 mg/dL — ABNORMAL HIGH (ref 70–99)
Potassium: 3.5 mmol/L (ref 3.5–5.1)
Sodium: 141 mmol/L (ref 135–145)
Total Bilirubin: 0.1 mg/dL — ABNORMAL LOW (ref 0.3–1.2)
Total Protein: 6.8 g/dL (ref 6.5–8.1)

## 2023-01-07 LAB — FERRITIN: Ferritin: 23 ng/mL (ref 11–307)

## 2023-01-07 NOTE — Telephone Encounter (Signed)
-----  Message from Earlie Server, MD sent at 01/07/2023  1:18 PM EST ----- Please add possible Venofer infusion with her next appt

## 2023-01-10 LAB — KAPPA/LAMBDA LIGHT CHAINS
Kappa free light chain: 48.3 mg/L — ABNORMAL HIGH (ref 3.3–19.4)
Kappa, lambda light chain ratio: 1.64 (ref 0.26–1.65)
Lambda free light chains: 29.4 mg/L — ABNORMAL HIGH (ref 5.7–26.3)

## 2023-01-12 LAB — MULTIPLE MYELOMA PANEL, SERUM
Albumin SerPl Elph-Mcnc: 3.1 g/dL (ref 2.9–4.4)
Albumin/Glob SerPl: 1 (ref 0.7–1.7)
Alpha 1: 0.2 g/dL (ref 0.0–0.4)
Alpha2 Glob SerPl Elph-Mcnc: 0.8 g/dL (ref 0.4–1.0)
B-Globulin SerPl Elph-Mcnc: 0.8 g/dL (ref 0.7–1.3)
Gamma Glob SerPl Elph-Mcnc: 1.3 g/dL (ref 0.4–1.8)
Globulin, Total: 3.2 g/dL (ref 2.2–3.9)
IgA: 204 mg/dL (ref 64–422)
IgG (Immunoglobin G), Serum: 1289 mg/dL (ref 586–1602)
IgM (Immunoglobulin M), Srm: 30 mg/dL (ref 26–217)
M Protein SerPl Elph-Mcnc: 0.7 g/dL — ABNORMAL HIGH
Total Protein ELP: 6.3 g/dL (ref 6.0–8.5)

## 2023-01-14 ENCOUNTER — Encounter: Payer: Self-pay | Admitting: Oncology

## 2023-01-14 ENCOUNTER — Inpatient Hospital Stay: Payer: Medicare Other

## 2023-01-14 ENCOUNTER — Inpatient Hospital Stay: Payer: Medicare Other | Admitting: Oncology

## 2023-01-14 VITALS — BP 117/56 | HR 51 | Resp 18

## 2023-01-14 VITALS — BP 145/68 | HR 60 | Temp 97.8°F | Resp 18 | Wt 166.9 lb

## 2023-01-14 DIAGNOSIS — N1832 Chronic kidney disease, stage 3b: Secondary | ICD-10-CM | POA: Diagnosis not present

## 2023-01-14 DIAGNOSIS — D631 Anemia in chronic kidney disease: Secondary | ICD-10-CM | POA: Diagnosis not present

## 2023-01-14 DIAGNOSIS — D696 Thrombocytopenia, unspecified: Secondary | ICD-10-CM | POA: Diagnosis not present

## 2023-01-14 DIAGNOSIS — D638 Anemia in other chronic diseases classified elsewhere: Secondary | ICD-10-CM | POA: Diagnosis not present

## 2023-01-14 DIAGNOSIS — D472 Monoclonal gammopathy: Secondary | ICD-10-CM | POA: Diagnosis not present

## 2023-01-14 MED ORDER — SODIUM CHLORIDE 0.9 % IV SOLN
200.0000 mg | Freq: Once | INTRAVENOUS | Status: AC
  Administered 2023-01-14: 200 mg via INTRAVENOUS
  Filled 2023-01-14: qty 10

## 2023-01-14 MED ORDER — SODIUM CHLORIDE 0.9 % IV SOLN
Freq: Once | INTRAVENOUS | Status: AC
  Filled 2023-01-14: qty 250

## 2023-01-14 NOTE — Progress Notes (Signed)
Pt here for follow up. Per pt's son, in the last 6 weeks, pt has been diagnosed with COPD/ emphysema.

## 2023-01-14 NOTE — Assessment & Plan Note (Addendum)
Observation  

## 2023-01-14 NOTE — Assessment & Plan Note (Addendum)
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 Labs are reviewed and discussed with patient. M protein 0.7, normal light chain ratio For now I recommend observation given her age and other medical conditions. monitor SPEP and light chain ratio

## 2023-01-14 NOTE — Progress Notes (Signed)
Hematology/Oncology Progress note [phone] [phone]         Patient Care Team: Latanya Maudlin, NP as PCP - General (Family Medicine)   REFERRING PROVIDER: Mackey Birchwood, MD  CHIEF COMPLAINTS/REASON FOR VISIT:  Anemia of chronic disease, MGUS  ASSESSMENT & PLAN:   Anemia in chronic kidney disease (CKD) Labs are reviewed and discussed with patient. Hemoglobin further decrease despite oral iron supplementation. I recommend IV venofer treatments. Rationale and side effects were reviewed and discussed.  Patient and her son agree with treatment.  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 Labs are reviewed and discussed with patient. M protein 0.7, normal light chain ratio For now I recommend observation given her age and other medical conditions. monitor SPEP and light chain ratio    Thrombocytopenia (HCC)  Observation.  Orders Placed This Encounter  Procedures   CBC with Differential/Platelet    Standing Status:   Future    Standing Expiration Date:   01/15/2024   Ferritin    Standing Status:   Future    Standing Expiration Date:   01/15/2024   Iron and TIBC    Standing Status:   Future    Standing Expiration Date:   01/15/2024   Retic Panel    Standing Status:   Future    Standing Expiration Date:   01/15/2024   Follow-up in 3 months. All questions were answered. The patient knows to call the clinic with any problems, questions or concerns.  Lauren Server, MD, PhD [facility] Hematology Oncology 01/14/2023     HISTORY OF PRESENTING ILLNESS:  Lauren Riddle is a  89 y.o.  female with PMH listed below who was referred to me for anemia Reviewed patient's recent labs that was done.  She was found to have abnormal CBC on 08/03/2022 hemoglobin 9.3, hematocrit 29.8, MCV 76.6.  Patient is chronically microcytic.  She has had better hemoglobin levels in the  past. Reviewed patient's previous labs ordered by primary care physician's office, anemia is chronic onset  She denies recent chest pain on exertion, shortness of breath on minimal exertion, pre-syncopal episodes, or palpitations She had not noticed any recent bleeding such as epistaxis, hematuria or hematochezia. + Dark stool.  Patient takes oral iron supplementation. She takes Mobic daily.  She has noticed some gas/bloating.  No abdominal pain Her last colonoscopy was  She denies any pica and eats a variety of diet.  Appetite is not great.  She noticed that she has lost couple of pounds.  Patient was accompanied by her son [name].   INTERVAL HISTORY COLA GANE is a 89 y.o. female who has above history reviewed by me today presents for follow up visit to review results. She takes iron supplementation twice daily with vitamin C.  Accompanied by son. No new complaints  MEDICAL HISTORY:  Past Medical History:  Diagnosis Date   Arthritis    knees   Cough    chronic   Depression    Diabetes mellitus without complication (HCC)    HOH (hard of hearing)    Hypertension    controlled on meds   Hypothyroidism    Pneumonia    in past   Seasonal allergies    Sleep apnea    per H&P, does not use CPAP   Thyroid disease    Vertigo    Wears dentures    full upper, partial lower  SURGICAL HISTORY: Past Surgical History:  Procedure Laterality Date   ABDOMINAL HYSTERECTOMY     CATARACT EXTRACTION W/PHACO Right 04/20/2017   Procedure: CATARACT EXTRACTION PHACO AND INTRAOCULAR LENS PLACEMENT ([facility]) Right Diabetic;  Surgeon: Leandrew Koyanagi, MD;  Location: [facility];  Service: Ophthalmology;  Laterality: Right;  diabetic-oral med and history of sleep apnea, no CPAP   CATARACT EXTRACTION W/PHACO Left 04/05/2018   Procedure: CATARACT EXTRACTION PHACO AND INTRAOCULAR LENS PLACEMENT ([place]) LEFT DIABETIC;  Surgeon: Leandrew Koyanagi, MD;  Location: [facility];   Service: Ophthalmology;  Laterality: Left;  Diabetic - oral meds   CHOLECYSTECTOMY     COLONOSCOPY     TUBAL LIGATION      SOCIAL HISTORY: Social History   Socioeconomic History   Marital status: Widowed    Spouse name: Not on file   Number of children: Not on file   Years of education: Not on file   Highest education level: Not on file  Occupational History   Not on file  Tobacco Use   Smoking status: Never   Smokeless tobacco: Current    Types: Snuff  Vaping Use   Vaping Use: Never used  Substance and Sexual Activity   Alcohol use: No    Alcohol/week: 0.0 standard drinks of alcohol   Drug use: No   Sexual activity: Not Currently    Birth control/protection: None  Other Topics Concern   Not on file  Social History Narrative   Not on file   Social Determinants of Health   Financial Resource Strain: Not on file  Food Insecurity: Not on file  Transportation Needs: Not on file  Physical Activity: Not on file  Stress: Not on file  Social Connections: Not on file  Intimate Partner Violence: Not on file    FAMILY HISTORY: Family History  Problem Relation Age of Onset   Cancer Father     ALLERGIES:  has No Known Allergies.  MEDICATIONS:  Current Outpatient Medications  Medication Sig Dispense Refill   ACCU-CHEK GUIDE test strip      cholecalciferol (VITAMIN D) 1000 units tablet Take 1,000 Units by mouth daily.     cyanocobalamin (VITAMIN B12) 1000 MCG tablet Take 2 tablets daily for 2 weeks, then reduce to 1 tablet daily thereafter for Vitamin B12 Deficiency.     cyclobenzaprine (FLEXERIL) 5 MG tablet TAKE 1 TABLET BY MOUTH  TWICE DAILY AS NEEDED FOR  MUSCLE SPASMS FOR UP TO 10  DAYS     diltiazem (CARDIZEM CD) 240 MG 24 hr capsule Take 180 mg by mouth daily.     ferrous sulfate 325 (65 FE) MG tablet Take 1 tablet (325 mg total) by mouth 2 (two) times daily with a meal. 180 tablet 2   glipiZIDE (GLUCOTROL) 5 MG tablet TAKE 1 TABLET BY MOUTH IN THE  MORNING  BEFORE BREAKFAST     glucose blood (PRECISION QID TEST) test strip 1 each (1 strip total) once daily And as needed     hydrochlorothiazide (HYDRODIURIL) 25 MG tablet Take by mouth.     levothyroxine (SYNTHROID, LEVOTHROID) 75 MCG tablet Take by mouth daily before breakfast.      losartan (COZAAR) 50 MG tablet Take by mouth.     lovastatin (MEVACOR) 20 MG tablet Take by mouth.     meloxicam (MOBIC) 7.5 MG tablet Take 1 tablet by mouth daily.     metFORMIN (GLUCOPHAGE) 500 MG tablet Take 500 mg by mouth daily with breakfast.  potassium chloride (KLOR-CON) 8 MEQ tablet Take by mouth.     torsemide (DEMADEX) 5 MG tablet Take 5 mg by mouth daily.     traZODone (DESYREL) 50 MG tablet Take 50 mg by mouth at bedtime.     BREO ELLIPTA 100-25 MCG/ACT AEPB Inhale 1 puff into the lungs daily.     Docusate Sodium (DSS) 100 MG CAPS Take 1 capsule by mouth 2 (two) times daily. (Patient not taking: Reported on 01/14/2023)     insulin aspart (NOVOLOG) 100 UNIT/ML injection Inject into the skin. (Patient not taking: Reported on 09/07/2022)     meclizine (ANTIVERT) 25 MG tablet TAKE 1 TABLET BY MOUTH 3  TIMES DAILY AS NEEDED FOR  DIZZINESS (Patient not taking: Reported on 01/14/2023)     No current facility-administered medications for this visit.    Review of Systems  Constitutional:  Positive for fatigue and unexpected weight change. Negative for appetite change, chills and fever.  HENT:   Negative for hearing loss and voice change.   Eyes:  Negative for eye problems.  Respiratory:  Negative for chest tightness and cough.   Cardiovascular:  Negative for chest pain.  Gastrointestinal:  Negative for abdominal distention, abdominal pain and blood in stool.  Endocrine: Negative for hot flashes.       Patient feels cold  Genitourinary:  Negative for difficulty urinating and frequency.   Musculoskeletal:  Negative for arthralgias.  Skin:  Negative for itching and rash.  Neurological:  Negative for extremity  weakness.  Hematological:  Negative for adenopathy.  Psychiatric/Behavioral:  Negative for confusion.     PHYSICAL EXAMINATION: ECOG PERFORMANCE STATUS: 2 - Symptomatic, <50% confined to bed Vitals:   01/14/23 1220  BP: (!) 145/68  Pulse: 60  Resp: 18  Temp: 97.8 F (36.6 C)   Filed Weights   01/14/23 1220  Weight: 166 lb 14.4 oz (75.7 kg)    Physical Exam Constitutional:      General: She is not in acute distress.    Comments: Elderly frail female sits in the wheelchair  HENT:     Head: Normocephalic and atraumatic.  Eyes:     General: No scleral icterus. Cardiovascular:     Rate and Rhythm: Normal rate.  Pulmonary:     Effort: Pulmonary effort is normal. No respiratory distress.  Abdominal:     General: There is no distension.  Musculoskeletal:     Cervical back: Normal range of motion.  Skin:    Findings: No rash.  Neurological:     Mental Status: She is alert. Mental status is at baseline.  Psychiatric:        Mood and Affect: Mood normal.     LABORATORY DATA:  I have reviewed the data as listed    Latest Ref Rng & Units 01/07/2023    9:15 AM 10/12/2022   11:46 AM 08/24/2022   10:22 AM  CBC  WBC 4.0 - 10.5 K/uL 3.4  4.8  3.5   Hemoglobin 12.0 - 15.0 g/dL 8.8  8.9  9.4   Hematocrit 36.0 - 46.0 % 28.1  29.1  30.3   Platelets 150 - 400 K/uL 141  161  142       Latest Ref Rng & Units 01/07/2023    9:15 AM 10/12/2022   11:46 AM 08/24/2022   10:22 AM  CMP  Glucose 70 - 99 mg/dL 168  125  161   BUN 8 - 23 mg/dL 22  23  28  Creatinine 0.44 - 1.00 mg/dL 1.23  1.17  1.40   Sodium 135 - 145 mmol/L 141  141  137   Potassium 3.5 - 5.1 mmol/L 3.5  3.7  3.2   Chloride 98 - 111 mmol/L 103  104  102   CO2 22 - 32 mmol/L '28  30  28   '$ Calcium 8.9 - 10.3 mg/dL 8.8  8.8  8.9   Total Protein 6.5 - 8.1 g/dL 6.8  7.4  7.3   Total Bilirubin 0.3 - 1.2 mg/dL 0.1  <0.1  0.2   Alkaline Phos 38 - 126 U/L 54  58  58   AST 15 - 41 U/L '18  14  19   '$ ALT 0 - 44 U/L '10  10   11       '$ Component Value Date/Time   IRON 38 01/07/2023 0915   TIBC 329 01/07/2023 0915   FERRITIN 23 01/07/2023 0915   IRONPCTSAT 12 01/07/2023 0915     RADIOGRAPHIC STUDIES: I have personally reviewed the radiological images as listed and agreed with the findings in the report. No results found.

## 2023-01-14 NOTE — Assessment & Plan Note (Signed)
Labs are reviewed and discussed with patient. Hemoglobin further decrease despite oral iron supplementation. I recommend IV venofer treatments. Rationale and side effects were reviewed and discussed.  Patient and her son agree with treatment.  Plan IV venofer weekly x 3

## 2023-01-21 ENCOUNTER — Inpatient Hospital Stay: Payer: Medicare Other

## 2023-01-21 VITALS — BP 128/53 | HR 63 | Temp 97.4°F | Resp 16

## 2023-01-21 DIAGNOSIS — D638 Anemia in other chronic diseases classified elsewhere: Secondary | ICD-10-CM | POA: Diagnosis not present

## 2023-01-21 DIAGNOSIS — D631 Anemia in chronic kidney disease: Secondary | ICD-10-CM

## 2023-01-21 MED ORDER — ALTEPLASE 2 MG IJ SOLR
2.0000 mg | Freq: Once | INTRAMUSCULAR | Status: DC | PRN
  Filled 2023-01-21: qty 2

## 2023-01-21 MED ORDER — HEPARIN SOD (PORK) LOCK FLUSH 100 UNIT/ML IV SOLN
500.0000 [IU] | Freq: Once | INTRAVENOUS | Status: DC | PRN
  Filled 2023-01-21: qty 5

## 2023-01-21 MED ORDER — SODIUM CHLORIDE 0.9 % IV SOLN
200.0000 mg | Freq: Once | INTRAVENOUS | Status: AC
  Administered 2023-01-21: 200 mg via INTRAVENOUS
  Filled 2023-01-21: qty 10

## 2023-01-21 MED ORDER — SODIUM CHLORIDE 0.9% FLUSH
10.0000 mL | Freq: Once | INTRAVENOUS | Status: DC | PRN
  Filled 2023-01-21: qty 10

## 2023-01-21 MED ORDER — HEPARIN SOD (PORK) LOCK FLUSH 100 UNIT/ML IV SOLN
250.0000 [IU] | Freq: Once | INTRAVENOUS | Status: DC | PRN
  Filled 2023-01-21: qty 5

## 2023-01-21 MED ORDER — SODIUM CHLORIDE 0.9% FLUSH
3.0000 mL | Freq: Once | INTRAVENOUS | Status: DC | PRN
  Filled 2023-01-21: qty 3

## 2023-01-21 MED ORDER — SODIUM CHLORIDE 0.9 % IV SOLN
Freq: Once | INTRAVENOUS | Status: AC
  Filled 2023-01-21: qty 250

## 2023-01-28 ENCOUNTER — Telehealth: Payer: Self-pay

## 2023-01-28 ENCOUNTER — Inpatient Hospital Stay: Payer: Medicare Other | Attending: Oncology

## 2023-01-28 VITALS — BP 106/42 | HR 52 | Temp 98.6°F | Resp 16

## 2023-01-28 DIAGNOSIS — D638 Anemia in other chronic diseases classified elsewhere: Secondary | ICD-10-CM | POA: Diagnosis present

## 2023-01-28 DIAGNOSIS — R5383 Other fatigue: Secondary | ICD-10-CM | POA: Insufficient documentation

## 2023-01-28 DIAGNOSIS — D472 Monoclonal gammopathy: Secondary | ICD-10-CM | POA: Insufficient documentation

## 2023-01-28 DIAGNOSIS — D696 Thrombocytopenia, unspecified: Secondary | ICD-10-CM | POA: Insufficient documentation

## 2023-01-28 DIAGNOSIS — D631 Anemia in chronic kidney disease: Secondary | ICD-10-CM

## 2023-01-28 DIAGNOSIS — R634 Abnormal weight loss: Secondary | ICD-10-CM | POA: Insufficient documentation

## 2023-01-28 DIAGNOSIS — K921 Melena: Secondary | ICD-10-CM | POA: Insufficient documentation

## 2023-01-28 DIAGNOSIS — R140 Abdominal distension (gaseous): Secondary | ICD-10-CM | POA: Diagnosis not present

## 2023-01-28 MED ORDER — SODIUM CHLORIDE 0.9 % IV SOLN
Freq: Once | INTRAVENOUS | Status: AC
  Filled 2023-01-28: qty 250

## 2023-01-28 MED ORDER — SODIUM CHLORIDE 0.9 % IV SOLN
200.0000 mg | Freq: Once | INTRAVENOUS | Status: AC
  Administered 2023-01-28: 200 mg via INTRAVENOUS
  Filled 2023-01-28: qty 200

## 2023-01-28 NOTE — Telephone Encounter (Signed)
-----   Message from Earlie Server, MD sent at 01/28/2023  3:36 PM EST ----- I ran into patient son who prefers a lab encounter before her next appt.  Please arrange her to get lab check cbc iron tibc ferritin in 4 weeks. Please contact son for appt. Thanks.

## 2023-02-25 ENCOUNTER — Other Ambulatory Visit: Payer: Medicare Other

## 2023-03-04 ENCOUNTER — Other Ambulatory Visit: Payer: Self-pay | Admitting: Oncology

## 2023-03-04 ENCOUNTER — Inpatient Hospital Stay: Payer: Medicare Other | Attending: Oncology

## 2023-03-04 DIAGNOSIS — D696 Thrombocytopenia, unspecified: Secondary | ICD-10-CM | POA: Diagnosis not present

## 2023-03-04 DIAGNOSIS — K921 Melena: Secondary | ICD-10-CM | POA: Insufficient documentation

## 2023-03-04 DIAGNOSIS — R140 Abdominal distension (gaseous): Secondary | ICD-10-CM | POA: Insufficient documentation

## 2023-03-04 DIAGNOSIS — D638 Anemia in other chronic diseases classified elsewhere: Secondary | ICD-10-CM | POA: Diagnosis present

## 2023-03-04 DIAGNOSIS — R634 Abnormal weight loss: Secondary | ICD-10-CM | POA: Diagnosis not present

## 2023-03-04 DIAGNOSIS — R5383 Other fatigue: Secondary | ICD-10-CM | POA: Diagnosis not present

## 2023-03-04 DIAGNOSIS — D631 Anemia in chronic kidney disease: Secondary | ICD-10-CM

## 2023-03-04 DIAGNOSIS — D472 Monoclonal gammopathy: Secondary | ICD-10-CM | POA: Insufficient documentation

## 2023-03-04 LAB — CBC WITH DIFFERENTIAL/PLATELET
Abs Immature Granulocytes: 0.01 10*3/uL (ref 0.00–0.07)
Basophils Absolute: 0 10*3/uL (ref 0.0–0.1)
Basophils Relative: 1 %
Eosinophils Absolute: 0.1 10*3/uL (ref 0.0–0.5)
Eosinophils Relative: 3 %
HCT: 26 % — ABNORMAL LOW (ref 36.0–46.0)
Hemoglobin: 8.1 g/dL — ABNORMAL LOW (ref 12.0–15.0)
Immature Granulocytes: 0 %
Lymphocytes Relative: 22 %
Lymphs Abs: 0.7 10*3/uL (ref 0.7–4.0)
MCH: 24.5 pg — ABNORMAL LOW (ref 26.0–34.0)
MCHC: 31.2 g/dL (ref 30.0–36.0)
MCV: 78.8 fL — ABNORMAL LOW (ref 80.0–100.0)
Monocytes Absolute: 0.4 10*3/uL (ref 0.1–1.0)
Monocytes Relative: 12 %
Neutro Abs: 2 10*3/uL (ref 1.7–7.7)
Neutrophils Relative %: 62 %
Platelets: 172 10*3/uL (ref 150–400)
RBC: 3.3 MIL/uL — ABNORMAL LOW (ref 3.87–5.11)
RDW: 14.3 % (ref 11.5–15.5)
WBC: 3.2 10*3/uL — ABNORMAL LOW (ref 4.0–10.5)
nRBC: 0 % (ref 0.0–0.2)

## 2023-03-04 LAB — IRON AND TIBC
Iron: 47 ug/dL (ref 28–170)
Saturation Ratios: 16 % (ref 10.4–31.8)
TIBC: 295 ug/dL (ref 250–450)
UIBC: 248 ug/dL

## 2023-03-04 LAB — FERRITIN: Ferritin: 74 ng/mL (ref 11–307)

## 2023-03-07 ENCOUNTER — Telehealth: Payer: Self-pay

## 2023-03-07 NOTE — Telephone Encounter (Signed)
Lauren Riddle can you schedule. I will contact patient and let her know that she needs these.   Recommend patient to start with retacrit during week of 3/11  Week of 3/25 H&H +/- retacrit  Week of 4/8   H&H +/- retacrit  Keep currently scheduled appt on 4/23. Add +/- retacrit too.

## 2023-03-07 NOTE — Telephone Encounter (Signed)
-----   Message from Earlie Server, MD sent at 03/04/2023 11:22 PM EST ----- Recommend patient to start with retacrit during week of 3/11 Week of 3/25 H&H +/- retacrit Week of 4/8   H&H +/- retacrit  Keep currently scheduled appt on 4/23. Add +/- retacrit too.

## 2023-03-08 ENCOUNTER — Inpatient Hospital Stay: Payer: Medicare Other

## 2023-03-08 VITALS — BP 139/58 | HR 70 | Resp 17

## 2023-03-08 DIAGNOSIS — D472 Monoclonal gammopathy: Secondary | ICD-10-CM | POA: Diagnosis not present

## 2023-03-08 DIAGNOSIS — D631 Anemia in chronic kidney disease: Secondary | ICD-10-CM

## 2023-03-08 MED ORDER — EPOETIN ALFA-EPBX 40000 UNIT/ML IJ SOLN
20000.0000 [IU] | Freq: Once | INTRAMUSCULAR | Status: AC
  Administered 2023-03-08: 20000 [IU] via SUBCUTANEOUS
  Filled 2023-03-08: qty 1

## 2023-03-11 ENCOUNTER — Encounter: Payer: Self-pay | Admitting: Oncology

## 2023-03-14 ENCOUNTER — Other Ambulatory Visit: Payer: Self-pay | Admitting: Oncology

## 2023-03-14 ENCOUNTER — Other Ambulatory Visit: Payer: Self-pay

## 2023-03-14 DIAGNOSIS — D631 Anemia in chronic kidney disease: Secondary | ICD-10-CM

## 2023-03-14 DIAGNOSIS — R109 Unspecified abdominal pain: Secondary | ICD-10-CM

## 2023-03-14 NOTE — Telephone Encounter (Signed)
Called and spoke to [place], son, who states that pt took muscle relaxer but that it didn't help. He is aware of retacrit dosage increase and the need for CT.   CT order entered.

## 2023-03-16 ENCOUNTER — Other Ambulatory Visit: Payer: Self-pay

## 2023-03-16 DIAGNOSIS — D631 Anemia in chronic kidney disease: Secondary | ICD-10-CM

## 2023-03-16 NOTE — Telephone Encounter (Signed)
Lauren Riddle, will you follow up on CT for this pt please. Not sure if anything else needs to be done besides the order being entered.

## 2023-03-17 ENCOUNTER — Inpatient Hospital Stay: Payer: Medicare Other

## 2023-03-17 ENCOUNTER — Ambulatory Visit
Admission: RE | Admit: 2023-03-17 | Discharge: 2023-03-17 | Disposition: A | Discharge status: Home / Self Care | Payer: Medicare Other | Source: Ambulatory Visit | Attending: Oncology | Admitting: Oncology

## 2023-03-17 VITALS — BP 133/77 | HR 77

## 2023-03-17 DIAGNOSIS — D631 Anemia in chronic kidney disease: Secondary | ICD-10-CM

## 2023-03-17 DIAGNOSIS — R109 Unspecified abdominal pain: Secondary | ICD-10-CM

## 2023-03-17 DIAGNOSIS — D472 Monoclonal gammopathy: Secondary | ICD-10-CM | POA: Diagnosis not present

## 2023-03-17 LAB — HEMOGLOBIN AND HEMATOCRIT, BLOOD
HCT: 29.7 % — ABNORMAL LOW (ref 36.0–46.0)
Hemoglobin: 9 g/dL — ABNORMAL LOW (ref 12.0–15.0)

## 2023-03-17 MED ORDER — EPOETIN ALFA-EPBX 40000 UNIT/ML IJ SOLN
40000.0000 [IU] | Freq: Once | INTRAMUSCULAR | Status: AC
  Administered 2023-03-17: 40000 [IU] via SUBCUTANEOUS
  Filled 2023-03-17: qty 1

## 2023-03-22 ENCOUNTER — Telehealth: Payer: Self-pay

## 2023-03-22 NOTE — Telephone Encounter (Signed)
-----   Message from Earlie Server, MD sent at 03/21/2023  9:45 PM EDT ----- CT abdomen result showed no explanation of her abdominal pain.  There is possible mild swelling of her bowel, not well visualized in current non contrast CT scan.  That will need further contrast study for work up.  If patient continues to have abdominal pain, I recommend her to further discuss with her PCP regarding pros and cons for contrast study.

## 2023-03-22 NOTE — Telephone Encounter (Signed)
Called and spoke to pt's son, [name]. He verbalized understanding. All questions and concerns answered.

## 2023-04-06 ENCOUNTER — Other Ambulatory Visit: Payer: Self-pay

## 2023-04-06 DIAGNOSIS — D631 Anemia in chronic kidney disease: Secondary | ICD-10-CM

## 2023-04-07 ENCOUNTER — Inpatient Hospital Stay: Payer: Medicare Other | Attending: Oncology

## 2023-04-07 ENCOUNTER — Inpatient Hospital Stay: Payer: Medicare Other

## 2023-04-07 VITALS — BP 148/76 | HR 67

## 2023-04-07 DIAGNOSIS — D638 Anemia in other chronic diseases classified elsewhere: Secondary | ICD-10-CM | POA: Insufficient documentation

## 2023-04-07 DIAGNOSIS — D696 Thrombocytopenia, unspecified: Secondary | ICD-10-CM | POA: Diagnosis not present

## 2023-04-07 DIAGNOSIS — D631 Anemia in chronic kidney disease: Secondary | ICD-10-CM

## 2023-04-07 DIAGNOSIS — D472 Monoclonal gammopathy: Secondary | ICD-10-CM | POA: Diagnosis not present

## 2023-04-07 LAB — HEMOGLOBIN AND HEMATOCRIT (CANCER CENTER ONLY)
HCT: 31.4 % — ABNORMAL LOW (ref 36.0–46.0)
Hemoglobin: 9.7 g/dL — ABNORMAL LOW (ref 12.0–15.0)

## 2023-04-07 MED ORDER — EPOETIN ALFA-EPBX 40000 UNIT/ML IJ SOLN
40000.0000 [IU] | Freq: Once | INTRAMUSCULAR | Status: AC
  Administered 2023-04-07: 40000 [IU] via SUBCUTANEOUS
  Filled 2023-04-07: qty 1

## 2023-04-15 ENCOUNTER — Other Ambulatory Visit: Payer: Medicare Other

## 2023-04-15 ENCOUNTER — Ambulatory Visit: Payer: Medicare Other | Admitting: Oncology

## 2023-04-19 ENCOUNTER — Ambulatory Visit: Payer: Medicare Other

## 2023-04-19 ENCOUNTER — Ambulatory Visit: Payer: Medicare Other | Admitting: Oncology

## 2023-07-15 DIAGNOSIS — R634 Abnormal weight loss: Secondary | ICD-10-CM | POA: Insufficient documentation

## 2023-10-19 ENCOUNTER — Emergency Department
Admission: EM | Admit: 2023-10-19 | Discharge: 2023-10-19 | Disposition: A | Discharge status: Home / Self Care | Payer: Medicare Other | Attending: Emergency Medicine | Admitting: Emergency Medicine

## 2023-10-19 ENCOUNTER — Other Ambulatory Visit: Payer: Self-pay

## 2023-10-19 ENCOUNTER — Emergency Department: Payer: Medicare Other

## 2023-10-19 DIAGNOSIS — K529 Noninfective gastroenteritis and colitis, unspecified: Secondary | ICD-10-CM | POA: Insufficient documentation

## 2023-10-19 DIAGNOSIS — E119 Type 2 diabetes mellitus without complications: Secondary | ICD-10-CM | POA: Insufficient documentation

## 2023-10-19 DIAGNOSIS — N390 Urinary tract infection, site not specified: Secondary | ICD-10-CM | POA: Insufficient documentation

## 2023-10-19 DIAGNOSIS — R1030 Lower abdominal pain, unspecified: Secondary | ICD-10-CM | POA: Diagnosis present

## 2023-10-19 LAB — URINALYSIS, ROUTINE W REFLEX MICROSCOPIC
Bilirubin Urine: NEGATIVE
Glucose, UA: NEGATIVE mg/dL
Hgb urine dipstick: NEGATIVE
Ketones, ur: NEGATIVE mg/dL
Nitrite: NEGATIVE
Protein, ur: NEGATIVE mg/dL
Specific Gravity, Urine: 1.013 (ref 1.005–1.030)
pH: 5 (ref 5.0–8.0)

## 2023-10-19 LAB — CBC
HCT: 25.1 % — ABNORMAL LOW (ref 36.0–46.0)
Hemoglobin: 7.8 g/dL — ABNORMAL LOW (ref 12.0–15.0)
MCH: 23.6 pg — ABNORMAL LOW (ref 26.0–34.0)
MCHC: 31.1 g/dL (ref 30.0–36.0)
MCV: 76.1 fL — ABNORMAL LOW (ref 80.0–100.0)
Platelets: 179 10*3/uL (ref 150–400)
RBC: 3.3 MIL/uL — ABNORMAL LOW (ref 3.87–5.11)
RDW: 14.3 % (ref 11.5–15.5)
WBC: 3.2 10*3/uL — ABNORMAL LOW (ref 4.0–10.5)
nRBC: 0 % (ref 0.0–0.2)

## 2023-10-19 LAB — COMPREHENSIVE METABOLIC PANEL
ALT: 9 U/L (ref 0–44)
AST: 13 U/L — ABNORMAL LOW (ref 15–41)
Albumin: 3.5 g/dL (ref 3.5–5.0)
Alkaline Phosphatase: 50 U/L (ref 38–126)
Anion gap: 10 (ref 5–15)
BUN: 29 mg/dL — ABNORMAL HIGH (ref 8–23)
CO2: 28 mmol/L (ref 22–32)
Calcium: 9 mg/dL (ref 8.9–10.3)
Chloride: 102 mmol/L (ref 98–111)
Creatinine, Ser: 1.36 mg/dL — ABNORMAL HIGH (ref 0.44–1.00)
GFR, Estimated: 36 mL/min — ABNORMAL LOW (ref >60)
Glucose, Bld: 130 mg/dL — ABNORMAL HIGH (ref 70–99)
Potassium: 3.4 mmol/L — ABNORMAL LOW (ref 3.5–5.1)
Sodium: 140 mmol/L (ref 135–145)
Total Bilirubin: 0.5 mg/dL (ref 0.3–1.2)
Total Protein: 6.9 g/dL (ref 6.5–8.1)

## 2023-10-19 LAB — LIPASE, BLOOD: Lipase: 23 U/L (ref 11–51)

## 2023-10-19 MED ORDER — IOHEXOL 300 MG/ML  SOLN
75.0000 mL | Freq: Once | INTRAMUSCULAR | Status: AC | PRN
  Administered 2023-10-19: 75 mL via INTRAVENOUS

## 2023-10-19 MED ORDER — SODIUM CHLORIDE 0.9 % IV SOLN
1.0000 g | Freq: Once | INTRAVENOUS | Status: AC
  Administered 2023-10-19: 1 g via INTRAVENOUS
  Filled 2023-10-19: qty 10

## 2023-10-19 NOTE — ED Provider Notes (Signed)
[facility] Provider Note    Event Date/Time   First MD Initiated Contact with Patient 10/19/23 1759     (approximate)   History   Abdominal Pain   HPI  Lauren Riddle is a 89 y.o. female who presents with lower abdominal pain.  She does have a history of diabetes.  She reports the pain has been present for about 3 days, referred in by PCP for evaluation.  Normal stools, no vomiting, history of appendectomy and hysterectomy     Physical Exam   Triage Vital Signs: ED Triage Vitals  Encounter Vitals Group     BP 10/19/23 1529 (!) 128/54     Systolic BP Percentile --      Diastolic BP Percentile --      Pulse Rate 10/19/23 1529 67     Resp 10/19/23 1529 18     Temp 10/19/23 1529 98.6 F (37 C)     Temp Source 10/19/23 1529 Oral     SpO2 10/19/23 1529 94 %     Weight 10/19/23 1535 63.5 kg (140 lb)     Height 10/19/23 1535 1.676 m (5\' 6" )     Head Circumference --      Peak Flow --      Pain Score 10/19/23 1535 5     Pain Loc --      Pain Education --      Exclude from Growth Chart --     Most recent vital signs: Vitals:   10/19/23 1529  BP: (!) 128/54  Pulse: 67  Resp: 18  Temp: 98.6 F (37 C)  SpO2: 94%     General: Awake, no distress.  CV:  Good peripheral perfusion.  Resp:  Normal effort.  Abd:  No distention.  Mild tenderness in the lower abdomen bilaterally Other:     ED Results / Procedures / Treatments   Labs (all labs ordered are listed, but only abnormal results are displayed) Labs Reviewed  COMPREHENSIVE METABOLIC PANEL - Abnormal; Notable for the following components:      Result Value   Potassium 3.4 (*)    Glucose, Bld 130 (*)    BUN 29 (*)    Creatinine, Ser 1.36 (*)    AST 13 (*)    GFR, Estimated 36 (*)    All other components within normal limits  CBC - Abnormal; Notable for the following components:   WBC 3.2 (*)    RBC 3.30 (*)    Hemoglobin 7.8 (*)    HCT 25.1 (*)    MCV 76.1 (*)    MCH  23.6 (*)    All other components within normal limits  URINALYSIS, ROUTINE W REFLEX MICROSCOPIC - Abnormal; Notable for the following components:   Color, Urine YELLOW (*)    APPearance HAZY (*)    Leukocytes,Ua LARGE (*)    Bacteria, UA RARE (*)    Non Squamous Epithelial PRESENT (*)    All other components within normal limits  LIPASE, BLOOD     EKG     RADIOLOGY CT abdomen and pelvis pending    PROCEDURES:  Critical Care performed:   Procedures   MEDICATIONS ORDERED IN ED: Medications  iohexol (OMNIPAQUE) 300 MG/ML solution 75 mL (75 mLs Intravenous Contrast Given 10/19/23 1856)  cefTRIAXone (ROCEPHIN) 1 g in sodium chloride 0.9 % 100 mL IVPB (0 g Intravenous Stopped 10/19/23 2136)     IMPRESSION / MDM / ASSESSMENT AND PLAN / ED COURSE  I reviewed the triage vital signs and the nursing notes. Patient's presentation is most consistent with acute presentation with potential threat to life or bodily function.  Patient presents with lower abdominal pain as detailed above, differential includes colitis, diverticulitis, UTI  Lab work reviewed and is overall not significant change from prior.  CT scan obtained and without acute abnormality, do recommend laxatives, Colace  Urinalysis consistent with urinary tract infection, treated with Rocephin  Appropriate for discharge with outpatient follow-up      FINAL CLINICAL IMPRESSION(S) / ED DIAGNOSES   Final diagnoses:  Enteritis  Lower urinary tract infectious disease     Rx / DC Orders   ED Discharge Orders     None        Note:  This document was prepared using Dragon voice recognition software and may include unintentional dictation errors.   Jene Every, MD 10/19/23 2216

## 2023-10-19 NOTE — ED Notes (Signed)
See triage notes. Patient c/o abdominal pain since this past weekend. Denies N/V/D

## 2023-10-19 NOTE — ED Triage Notes (Signed)
Pt presents to ED with c/o of ABD pain since this past weekend. Pt denies any N/V/D. Pt denies fevers or chills. NAD noted.

## 2023-10-19 NOTE — Discharge Instructions (Signed)
Please take miralax daily as well as a stool softener and follow up closely with your PCP

## 2023-12-01 ENCOUNTER — Other Ambulatory Visit: Payer: Self-pay

## 2023-12-01 ENCOUNTER — Emergency Department: Payer: Medicare Other

## 2023-12-01 ENCOUNTER — Encounter: Payer: Self-pay | Admitting: Emergency Medicine

## 2023-12-01 ENCOUNTER — Inpatient Hospital Stay
Admission: EM | Admit: 2023-12-01 | Discharge: 2023-12-03 | DRG: 375 | Disposition: A | Discharge status: Hospice | Payer: Medicare Other | Attending: Internal Medicine | Admitting: Internal Medicine

## 2023-12-01 DIAGNOSIS — D631 Anemia in chronic kidney disease: Secondary | ICD-10-CM | POA: Diagnosis present

## 2023-12-01 DIAGNOSIS — R4189 Other symptoms and signs involving cognitive functions and awareness: Secondary | ICD-10-CM

## 2023-12-01 DIAGNOSIS — Z859 Personal history of malignant neoplasm, unspecified: Secondary | ICD-10-CM | POA: Diagnosis not present

## 2023-12-01 DIAGNOSIS — E1122 Type 2 diabetes mellitus with diabetic chronic kidney disease: Secondary | ICD-10-CM | POA: Diagnosis present

## 2023-12-01 DIAGNOSIS — N1831 Chronic kidney disease, stage 3a: Secondary | ICD-10-CM | POA: Diagnosis present

## 2023-12-01 DIAGNOSIS — I129 Hypertensive chronic kidney disease with stage 1 through stage 4 chronic kidney disease, or unspecified chronic kidney disease: Secondary | ICD-10-CM | POA: Diagnosis present

## 2023-12-01 DIAGNOSIS — E039 Hypothyroidism, unspecified: Secondary | ICD-10-CM | POA: Diagnosis present

## 2023-12-01 DIAGNOSIS — Z7951 Long term (current) use of inhaled steroids: Secondary | ICD-10-CM | POA: Diagnosis not present

## 2023-12-01 DIAGNOSIS — I10 Essential (primary) hypertension: Secondary | ICD-10-CM | POA: Diagnosis not present

## 2023-12-01 DIAGNOSIS — E785 Hyperlipidemia, unspecified: Secondary | ICD-10-CM | POA: Diagnosis present

## 2023-12-01 DIAGNOSIS — D472 Monoclonal gammopathy: Secondary | ICD-10-CM | POA: Diagnosis present

## 2023-12-01 DIAGNOSIS — M170 Bilateral primary osteoarthritis of knee: Secondary | ICD-10-CM | POA: Diagnosis present

## 2023-12-01 DIAGNOSIS — Z7189 Other specified counseling: Secondary | ICD-10-CM | POA: Diagnosis not present

## 2023-12-01 DIAGNOSIS — Z90710 Acquired absence of both cervix and uterus: Secondary | ICD-10-CM

## 2023-12-01 DIAGNOSIS — I444 Left anterior fascicular block: Secondary | ICD-10-CM | POA: Diagnosis present

## 2023-12-01 DIAGNOSIS — Z8701 Personal history of pneumonia (recurrent): Secondary | ICD-10-CM

## 2023-12-01 DIAGNOSIS — C188 Malignant neoplasm of overlapping sites of colon: Principal | ICD-10-CM | POA: Diagnosis present

## 2023-12-01 DIAGNOSIS — Z79890 Hormone replacement therapy (postmenopausal): Secondary | ICD-10-CM | POA: Diagnosis not present

## 2023-12-01 DIAGNOSIS — C787 Secondary malignant neoplasm of liver and intrahepatic bile duct: Secondary | ICD-10-CM | POA: Diagnosis present

## 2023-12-01 DIAGNOSIS — C772 Secondary and unspecified malignant neoplasm of intra-abdominal lymph nodes: Secondary | ICD-10-CM | POA: Diagnosis present

## 2023-12-01 DIAGNOSIS — Z79899 Other long term (current) drug therapy: Secondary | ICD-10-CM

## 2023-12-01 DIAGNOSIS — Z7984 Long term (current) use of oral hypoglycemic drugs: Secondary | ICD-10-CM | POA: Diagnosis not present

## 2023-12-01 DIAGNOSIS — Z515 Encounter for palliative care: Secondary | ICD-10-CM

## 2023-12-01 DIAGNOSIS — E1169 Type 2 diabetes mellitus with other specified complication: Secondary | ICD-10-CM | POA: Diagnosis present

## 2023-12-01 DIAGNOSIS — R1084 Generalized abdominal pain: Secondary | ICD-10-CM | POA: Diagnosis present

## 2023-12-01 DIAGNOSIS — K56609 Unspecified intestinal obstruction, unspecified as to partial versus complete obstruction: Secondary | ICD-10-CM | POA: Diagnosis present

## 2023-12-01 DIAGNOSIS — Z720 Tobacco use: Secondary | ICD-10-CM | POA: Diagnosis not present

## 2023-12-01 DIAGNOSIS — G4733 Obstructive sleep apnea (adult) (pediatric): Secondary | ICD-10-CM | POA: Diagnosis present

## 2023-12-01 DIAGNOSIS — F0390 Unspecified dementia without behavioral disturbance: Secondary | ICD-10-CM | POA: Diagnosis present

## 2023-12-01 DIAGNOSIS — Z66 Do not resuscitate: Secondary | ICD-10-CM | POA: Diagnosis present

## 2023-12-01 LAB — MAGNESIUM: Magnesium: 2.1 mg/dL (ref 1.7–2.4)

## 2023-12-01 LAB — COMPREHENSIVE METABOLIC PANEL
ALT: 12 U/L (ref 0–44)
AST: 15 U/L (ref 15–41)
Albumin: 3.6 g/dL (ref 3.5–5.0)
Alkaline Phosphatase: 55 U/L (ref 38–126)
Anion gap: 11 (ref 5–15)
BUN: 39 mg/dL — ABNORMAL HIGH (ref 8–23)
CO2: 28 mmol/L (ref 22–32)
Calcium: 9.2 mg/dL (ref 8.9–10.3)
Chloride: 103 mmol/L (ref 98–111)
Creatinine, Ser: 1.28 mg/dL — ABNORMAL HIGH (ref 0.44–1.00)
GFR, Estimated: 39 mL/min — ABNORMAL LOW (ref >60)
Glucose, Bld: 142 mg/dL — ABNORMAL HIGH (ref 70–99)
Potassium: 3.5 mmol/L (ref 3.5–5.1)
Sodium: 142 mmol/L (ref 135–145)
Total Bilirubin: 0.6 mg/dL (ref <1.2)
Total Protein: 7.5 g/dL (ref 6.5–8.1)

## 2023-12-01 LAB — CBC
HCT: 31.6 % — ABNORMAL LOW (ref 36.0–46.0)
Hemoglobin: 10 g/dL — ABNORMAL LOW (ref 12.0–15.0)
MCH: 23.5 pg — ABNORMAL LOW (ref 26.0–34.0)
MCHC: 31.6 g/dL (ref 30.0–36.0)
MCV: 74.2 fL — ABNORMAL LOW (ref 80.0–100.0)
Platelets: 261 10*3/uL (ref 150–400)
RBC: 4.26 MIL/uL (ref 3.87–5.11)
RDW: 15.8 % — ABNORMAL HIGH (ref 11.5–15.5)
WBC: 5.4 10*3/uL (ref 4.0–10.5)
nRBC: 0 % (ref 0.0–0.2)

## 2023-12-01 LAB — LIPASE, BLOOD: Lipase: 22 U/L (ref 11–51)

## 2023-12-01 LAB — LACTIC ACID, PLASMA: Lactic Acid, Venous: 1.7 mmol/L (ref 0.5–1.9)

## 2023-12-01 MED ORDER — FLUTICASONE FUROATE-VILANTEROL 100-25 MCG/ACT IN AEPB
1.0000 | INHALATION_SPRAY | Freq: Every day | RESPIRATORY_TRACT | Status: DC
  Administered 2023-12-02 – 2023-12-03 (×2): 1 via RESPIRATORY_TRACT
  Filled 2023-12-01: qty 28

## 2023-12-01 MED ORDER — ONDANSETRON HCL 4 MG/2ML IJ SOLN: 4.0000 mg | Freq: Four times a day (QID) | INTRAMUSCULAR | Status: DC | PRN

## 2023-12-01 MED ORDER — HYDROMORPHONE HCL 1 MG/ML IJ SOLN: 0.5000 mg | INTRAMUSCULAR | Status: DC | PRN

## 2023-12-01 MED ORDER — SODIUM CHLORIDE 0.9% FLUSH
3.0000 mL | Freq: Two times a day (BID) | INTRAVENOUS | Status: DC
  Administered 2023-12-02 – 2023-12-03 (×3): 3 mL via INTRAVENOUS

## 2023-12-01 MED ORDER — DEXAMETHASONE SODIUM PHOSPHATE 10 MG/ML IJ SOLN
8.0000 mg | Freq: Every day | INTRAMUSCULAR | Status: DC
  Administered 2023-12-01 – 2023-12-03 (×3): 8 mg via INTRAVENOUS
  Filled 2023-12-01 (×3): qty 1

## 2023-12-01 MED ORDER — POTASSIUM CHLORIDE 10 MEQ/100ML IV SOLN
10.0000 meq | INTRAVENOUS | Status: AC
Start: 2023-12-01 — End: 2023-12-01
  Administered 2023-12-01 (×4): 10 meq via INTRAVENOUS
  Filled 2023-12-01 (×2): qty 100

## 2023-12-01 MED ORDER — LORAZEPAM 2 MG/ML IJ SOLN: 0.5000 mg | Freq: Four times a day (QID) | INTRAMUSCULAR | Status: DC | PRN

## 2023-12-01 MED ORDER — IOHEXOL 300 MG/ML  SOLN
60.0000 mL | Freq: Once | INTRAMUSCULAR | Status: AC | PRN
  Administered 2023-12-01: 60 mL via INTRAVENOUS

## 2023-12-01 MED ORDER — OCTREOTIDE ACETATE 100 MCG/ML IJ SOLN
200.0000 ug | Freq: Two times a day (BID) | INTRAMUSCULAR | Status: DC
  Filled 2023-12-01: qty 2
  Filled 2023-12-01: qty 4
  Filled 2023-12-01 (×4): qty 2

## 2023-12-01 NOTE — ED Triage Notes (Signed)
Pt from home via ems with reports that pt began having abd pain last night that is sharp in nature. Abd distended. Pt reports last BM yesterday. A/O x 4.  158/84 84NSR 99RA 172 CBG

## 2023-12-01 NOTE — ED Provider Notes (Signed)
[facility] Provider Note    Event Date/Time   First MD Initiated Contact with Patient 12/01/23 1229     (approximate)   History   Abdominal Pain   HPI  Lauren Riddle is a 89 y.o. female presents the ER for evaluation of abdominal pain and distention.  Progressive worsening of the past few days.  Did have a normal bowel movement yesterday.  Does have a history of constipation but this feels different.  Has had some decreased p.o. intake.  No measured fevers or chills.     Physical Exam   Triage Vital Signs: ED Triage Vitals [12/01/23 1127]  Encounter Vitals Group     BP 130/75     Systolic BP Percentile      Diastolic BP Percentile      Pulse Rate 83     Resp 16     Temp 98.3 F (36.8 C)     Temp Source Oral     SpO2 97 %     Weight 138 lb 14.2 oz (63 kg)     Height 5\' 6"  (1.676 m)     Head Circumference      Peak Flow      Pain Score 8     Pain Loc      Pain Education      Exclude from Growth Chart     Most recent vital signs: Vitals:   12/01/23 1127 12/01/23 1400  BP: 130/75 134/78  Pulse: 83 87  Resp: 16 18  Temp: 98.3 F (36.8 C)   SpO2: 97% 97%     Constitutional: Alert  Eyes: Conjunctivae are normal.  Head: Atraumatic. Nose: No congestion/rhinnorhea. Mouth/Throat: Mucous membranes are moist.   Neck: Painless ROM.  Cardiovascular:   Good peripheral circulation. Respiratory: Normal respiratory effort.  No retractions.  Gastrointestinal: Soft and nontender.  Musculoskeletal:  no deformity Neurologic:  MAE spontaneously. No gross focal neurologic deficits are appreciated.  Skin:  Skin is warm, dry and intact. No rash noted. Psychiatric: Mood and affect are normal. Speech and behavior are normal.    ED Results / Procedures / Treatments   Labs (all labs ordered are listed, but only abnormal results are displayed) Labs Reviewed  COMPREHENSIVE METABOLIC PANEL - Abnormal; Notable for the following components:       Result Value   Glucose, Bld 142 (*)    BUN 39 (*)    Creatinine, Ser 1.28 (*)    GFR, Estimated 39 (*)    All other components within normal limits  CBC - Abnormal; Notable for the following components:   Hemoglobin 10.0 (*)    HCT 31.6 (*)    MCV 74.2 (*)    MCH 23.5 (*)    RDW 15.8 (*)    All other components within normal limits  LIPASE, BLOOD  LACTIC ACID, PLASMA  URINALYSIS, ROUTINE W REFLEX MICROSCOPIC     EKG  ED ECG REPORT I, Willy Eddy, the attending physician, personally viewed and interpreted this ECG.   Date: 12/01/2023  EKG Time: 11:35  Rate: 90  Rhythm: sinus  Axis: left  Intervals: normal  ST&T Change: no stemi, no depressions    RADIOLOGY Please see ED Course for my review and interpretation.  I personally reviewed all radiographic images ordered to evaluate for the above acute complaints and reviewed radiology reports and findings.  These findings were personally discussed with the patient.  Please see medical record for radiology report.  PROCEDURES:  Critical Care performed: No  Procedures   MEDICATIONS ORDERED IN ED: Medications  iohexol (OMNIPAQUE) 300 MG/ML solution 60 mL (60 mLs Intravenous Contrast Given 12/01/23 1424)     IMPRESSION / MDM / ASSESSMENT AND PLAN / ED COURSE  I reviewed the triage vital signs and the nursing notes.                              Differential diagnosis includes, but is not limited to, sbo, colitis, diverticulitis, mass, mi  Patient presenting to the ER for evaluation of symptoms as described above.  Based on symptoms, risk factors and considered above differential, this presenting complaint could reflect a potentially life-threatening illness therefore the patient will be placed on continuous pulse oximetry and telemetry for monitoring.  Laboratory evaluation will be sent to evaluate for the above complaints.      Clinical Course as of 12/01/23 1503  Thu Dec 01, 2023  1340 Lactate is normal  suggesting against mesenteric ischemia.  Still waiting CT imaging. [PR]  1452 CT abdomen concerning for diffuse dilated small bowel is concerning for bowel obstruction will await formal radiology report. [PR]  1503 Patient signed out oncoming physician pending follow-up imaging results. [PR]    Clinical Course User Index [PR] Willy Eddy, MD     FINAL CLINICAL IMPRESSION(S) / ED DIAGNOSES   Final diagnoses:  Generalized abdominal pain     Rx / DC Orders   ED Discharge Orders     None        Note:  This document was prepared using Dragon voice recognition software and may include unintentional dictation errors.    Willy Eddy, MD 12/01/23 [phone]

## 2023-12-01 NOTE — Assessment & Plan Note (Addendum)
-   Blood pressure currently within goal, initially home antihypertensives were held due to her being NPO. -We can restart as needed

## 2023-12-01 NOTE — Assessment & Plan Note (Signed)
Long-term history of MGUS.  Unlikely to be contributing to current presentation.  Follows with oncology.

## 2023-12-01 NOTE — ED Triage Notes (Signed)
Pt to ED via [facility] from home. Pt states that she is unsure why she is here and is unsure who called EMS. Per note in computer from first nurse, EMS was called because pt was having abdominal pain and her abdomen is distended. Pt is A & O x 2 in triage.

## 2023-12-01 NOTE — Assessment & Plan Note (Signed)
Renal function currently at baseline.  - Daily BMP - SSI, very sensitive - Hold home regimen

## 2023-12-01 NOTE — ED Provider Notes (Signed)
4:42 PM Assumed care for off going team.   Blood pressure 134/78, pulse 87, temperature 98.6 F (37 C), temperature source Oral, resp. rate 18, height 5\' 6"  (1.676 m), weight 63 kg, SpO2 97%.  See their HPI for full report but in brief pending CT  1. There is high-grade small-bowel obstruction secondary to a 3.5 cm  long irregular thickening at the ileocecal junction region, which is  highly concerning for neoplastic process. Correlation with  colonoscopy and tissue sampling is recommended.  2. Redemonstration of an ill-defined hypoattenuating liver lesion,  incompletely characterized on the current examination but grossly  similar to the prior study. This is concerning for metastases.  Further evaluation with nonemergent contrast-enhanced MRI abdomen as  per liver mass protocol is recommended.  3. There is a single heterogeneous right lower quadrant mesenteric  lymph node, also concerning for metastases.    Discussed with the son and patient about the CT results.  They expressed understanding of concern for new cancer causing bowel obstruction.  This time they would like to try conservative treatment and surgery Dr Tonna Boehringer  will see them tomorrow to discuss palliative option if not getting better.  Patient will be placed on NG tube.  Will discuss the hospital team for admission.     Concha Se, MD 12/01/23 [phone]

## 2023-12-01 NOTE — H&P (Signed)
History and Physical    Patient: Lauren Riddle:[id number] DOB: 1926/10/06 DOA: 12/01/2023 DOS: the patient was seen and examined on 12/01/2023 PCP: Luciana Axe, NP  Patient coming from: Home  Chief Complaint:  Chief Complaint  Patient presents with   Abdominal Pain   HPI: Lauren Riddle is a 89 y.o. female with medical history significant of MGUS, type 2 diabetes, hypertension, hyperlipidemia, hypothyroidism, OSA not on CPAP, memory deficit, anemia of chronic renal disease, who presents to the ED due to abdominal pain.  History obtained from patient's son at bedside, as well as from patient. [name] states that Lauren Riddle has been experiencing pain for at least 2-3 weeks but it had subsided after she was given steroids for neck pain. Since stopping steroids, the pain has returned and worsened. In the last 24 hours, they have noticed more abdominal distention and that is why she came to the ED. Lauren Riddle endorses nausea, but no vomiting. She denies chest pain, SOB, palpitations. Last BM was yesterday.   ED course: On arrival to the ED, patient was normotensive at 130/75 with heart rate of 83.  She was saturating at send 97% on room air.  She was afebrile at 98.3.  Initial workup notable for hemoglobin of 10.0, glucose 142, BUN 39, creatinine 1.28 with GFR 39.  Lactic acid 1.7.  CT of the abdomen was obtained that demonstrated high-grade small bowel obstruction secondary to 3.5 cm long irregular thickening of the ileocecal junction region concerning for neoplastic process, liver mass concerning for metastasis, enlarged right lower quadrant mesenteric lymph node.  General surgery consulted with recommendation to place NG tube.  [facility] contacted for admission.   Review of Systems: As mentioned in the history of present illness. All other systems reviewed and are negative.  Past Medical History:  Diagnosis Date   Arthritis    knees   Cough    chronic   Depression    Diabetes  mellitus without complication (HCC)    HOH (hard of hearing)    Hypertension    controlled on meds   Hypothyroidism    Pneumonia    in past   Seasonal allergies    Sleep apnea    per H&P, does not use CPAP   Thyroid disease    Vertigo    Wears dentures    full upper, partial lower   Past Surgical History:  Procedure Laterality Date   ABDOMINAL HYSTERECTOMY     CATARACT EXTRACTION W/PHACO Right 04/20/2017   Procedure: CATARACT EXTRACTION PHACO AND INTRAOCULAR LENS PLACEMENT (IOC) Right Diabetic;  Surgeon: Lockie Mola, MD;  Location: [facility];  Service: Ophthalmology;  Laterality: Right;  diabetic-oral med and history of sleep apnea, no CPAP   CATARACT EXTRACTION W/PHACO Left 04/05/2018   Procedure: CATARACT EXTRACTION PHACO AND INTRAOCULAR LENS PLACEMENT (IOC) LEFT DIABETIC;  Surgeon: Lockie Mola, MD;  Location: [facility];  Service: Ophthalmology;  Laterality: Left;  Diabetic - oral meds   CHOLECYSTECTOMY     COLONOSCOPY     TUBAL LIGATION     Social History:  reports that she has never smoked. Her smokeless tobacco use includes snuff. She reports that she does not drink alcohol and does not use drugs.  No Known Allergies  Family History  Problem Relation Age of Onset   Cancer Father     Prior to Admission medications   Medication Sig Start Date End Date Taking? Authorizing Provider  ACCU-CHEK GUIDE test strip  04/07/22  [provider]  BREO ELLIPTA 100-25 MCG/ACT AEPB Inhale 1 puff into the lungs daily.    [provider]  cholecalciferol (VITAMIN D) 1000 units tablet Take 1,000 Units by mouth daily.    [provider]  cyanocobalamin (VITAMIN B12) 1000 MCG tablet Take 2 tablets daily for 2 weeks, then reduce to 1 tablet daily thereafter for Vitamin B12 Deficiency. 05/01/20   [provider]  cyclobenzaprine (FLEXERIL) 5 MG tablet TAKE 1 TABLET BY MOUTH  TWICE DAILY AS NEEDED FOR  MUSCLE SPASMS FOR UP TO  10  DAYS 12/24/21   [provider]  diltiazem (CARDIZEM CD) 240 MG 24 hr capsule Take 180 mg by mouth daily.    [provider]  Docusate Sodium (DSS) 100 MG CAPS Take 1 capsule by mouth 2 (two) times daily. Patient not taking: Reported on 01/14/2023 05/10/22   [provider]  ferrous sulfate 325 (65 FE) MG tablet Take 1 tablet (325 mg total) by mouth 2 (two) times daily with a meal. 09/07/22 09/07/23  Rickard Patience, MD  glipiZIDE (GLUCOTROL) 5 MG tablet TAKE 1 TABLET BY MOUTH IN THE  MORNING BEFORE BREAKFAST 07/20/22   [provider]  hydrochlorothiazide (HYDRODIURIL) 25 MG tablet Take by mouth.    [provider]  insulin aspart (NOVOLOG) 100 UNIT/ML injection Inject into the skin. Patient not taking: Reported on 09/07/2022    [provider]  levothyroxine (SYNTHROID, LEVOTHROID) 75 MCG tablet Take by mouth daily before breakfast.     [provider]  losartan (COZAAR) 50 MG tablet Take by mouth.    [provider]  lovastatin (MEVACOR) 20 MG tablet Take by mouth.    [provider]  meclizine (ANTIVERT) 25 MG tablet TAKE 1 TABLET BY MOUTH 3  TIMES DAILY AS NEEDED FOR  DIZZINESS Patient not taking: Reported on 01/14/2023 10/23/21   [provider]  meloxicam (MOBIC) 7.5 MG tablet Take 1 tablet by mouth daily. 06/07/22   [provider]  metFORMIN (GLUCOPHAGE) 500 MG tablet Take 500 mg by mouth daily with breakfast.    [provider]  potassium chloride (KLOR-CON) 8 MEQ tablet Take by mouth.    [provider]  torsemide (DEMADEX) 5 MG tablet Take 5 mg by mouth daily. 05/12/22   [provider]  traZODone (DESYREL) 50 MG tablet Take 50 mg by mouth at bedtime. 03/22/22   [provider]    Physical Exam: Vitals:   12/01/23 1612 12/01/23 1830 12/01/23 1900 12/01/23 1930  BP:  (!) 150/71 (!) 151/71 (!) 162/68  Pulse:  63 62 74  Resp:      Temp: 98.6 F (37 C)      TempSrc: Oral     SpO2:  99% 99% 98%  Weight:      Height:       Physical Exam Vitals and nursing note reviewed.  Constitutional:      General: She is not in acute distress.    Appearance: She is normal weight.  HENT:     Head: Normocephalic and atraumatic.  Cardiovascular:     Rate and Rhythm: Normal rate and regular rhythm.     Heart sounds: No murmur heard. Pulmonary:     Effort: Pulmonary effort is normal. No respiratory distress.     Breath sounds: Normal breath sounds. No wheezing, rhonchi or rales.  Abdominal:     General: Bowel sounds are absent. There is distension.     Palpations: Abdomen is  soft.     Tenderness: There is no abdominal tenderness.     Hernia: No hernia is present.  Musculoskeletal:     Right lower leg: No edema.     Left lower leg: No edema.  Skin:    General: Skin is warm and dry.  Neurological:     General: No focal deficit present.     Mental Status: She is alert. Mental status is at baseline.  Psychiatric:        Mood and Affect: Mood normal.        Behavior: Behavior normal.    Data Reviewed: CBC with WBC of 5.4, hemoglobin of 10.0, MCV of 74.2, platelets of 261 CMP with sodium of 142, potassium 3.5, bicarb 28, glucose 142, BUN 39, creatinine 0.28, AST 15, ALT 12, GFR 39 Lactic acid 1.7  EKG personally reviewed.  Sinus rhythm with rate of 89.  CT ABDOMEN PELVIS W CONTRAST  Result Date: 12/01/2023 CLINICAL DATA:  Bowel obstruction suspected Abdominal pain, acute, nonlocalized. * Tracking Code: BO * EXAM: CT ABDOMEN AND PELVIS WITH CONTRAST TECHNIQUE: Multidetector CT imaging of the abdomen and pelvis was performed using the standard protocol following bolus administration of intravenous contrast. RADIATION DOSE REDUCTION: This exam was performed according to the departmental dose-optimization program which includes automated exposure control, adjustment of the mA and/or kV according to patient size and/or use of iterative reconstruction  technique. CONTRAST:  60mL OMNIPAQUE IOHEXOL 300 MG/ML  SOLN COMPARISON:  CT scan abdomen and pelvis from 10/19/2023. FINDINGS: Lower chest: There are patchy atelectatic changes in the visualized lung bases. No overt consolidation. No pleural effusion. The heart is normal in size. No pericardial effusion. Hepatobiliary: The liver is normal in size. Non-cirrhotic configuration. Redemonstration of an ill-defined, hypoattenuating, approximately 2.2 x 2.6 cm lesion in the subcapsular right hepatic lobe, at the junction of segments 5/8, which is incompletely characterized on the current examination but appear grossly similar to the prior study. No intrahepatic or extrahepatic bile duct dilation. Gallbladder is surgically absent. Pancreas: Unremarkable. No pancreatic ductal dilatation or surrounding inflammatory changes. Spleen: Within normal limits. No focal lesion. Adrenals/Urinary Tract: Adrenal glands are unremarkable. No suspicious renal mass. No hydronephrosis. No renal or ureteric calculi. Unremarkable urinary bladder. Stomach/Bowel: There is an approximately 3.5 cm long focal irregular thickening measuring up to 9 mm, at the level of ileocecal junction region, proximal to which small bowel loops are dilated measuring up to 5.1 cm in diameter. The colon is collapsed/nondilated. Findings are highly concerning for neoplastic process at ileocecal junction region. Correlation with colonoscopy and tissue sampling is recommended. The appendix is not distinctly visualized. Vascular/Lymphatic: No ascites or pneumoperitoneum. There is a single heterogeneous 1.3 x 1.5 cm right lower quadrant mesenteric lymph node, which is concerning for metastatic. No other abdominal or pelvic lymphadenopathy, by size criteria. No aneurysmal dilation of the major abdominal arteries. There are moderate peripheral atherosclerotic vascular calcifications of the aorta and its major branches. Reproductive: The uterus is surgically absent. No  large adnexal mass. Other: There are bilateral tiny fat containing inguinal hernias. The soft tissues and abdominal wall are otherwise unremarkable. Musculoskeletal: No suspicious osseous lesions. There are mild - moderate multilevel degenerative changes in the visualized spine. IMPRESSION: 1. There is high-grade small-bowel obstruction secondary to a 3.5 cm long irregular thickening at the ileocecal junction region, which is highly concerning for neoplastic process. Correlation with colonoscopy and tissue sampling is recommended. 2. Redemonstration of an ill-defined hypoattenuating liver lesion, incompletely characterized on the  current examination but grossly similar to the prior study. This is concerning for metastases. Further evaluation with nonemergent contrast-enhanced MRI abdomen as per liver mass protocol is recommended. 3. There is a single heterogeneous right lower quadrant mesenteric lymph node, also concerning for metastases. 4. Multiple other nonacute observations, as described above. Electronically Signed   By: Jules Schick M.D.   On: 12/01/2023 16:37    There are no new results to review at this time.  Assessment and Plan:  * SBO (small bowel obstruction) (HCC) Patient is presenting with sudden abdominal pain over the last day found to have a high-grade SBO suspected to be malignant in addition to lymph and liver metastasis. Per chart review, she was seen for abdominal pain in mid October, at which time imaging demonstrated bowel wall thickening in the same region with previously present liver mass that was much smaller previously.  Overall, supporting malignant etiology.  Per discussion with patient and her son at bedside, they would not want anything invasive done.  They would like to see if medical management can provide her some relief.  Lauren Riddle is goal is to be able to go home; I suggested home hospice and they are open to this.  - General Surgery consulted; appreciate their  recommendations - Continue NG tube to low intermittent suction - Trial of Octreotide 200 mcg TID given patient's desire to treat medically - Trial of dexamethasone 8 mg IV daily with octreotide - Ativan as needed for anxiety - N.p.o. - IV fluids - Electrolyte replacement as ordered - Consider [facility] contact in the AM  Type 2 diabetes mellitus with stage 3a chronic kidney disease, without long-term current use of insulin (HCC) Renal function currently at baseline.  - Daily BMP - SSI, very sensitive - Hold home regimen  Essential hypertension - Hold home regimen in the setting of n.p.o. status  MGUS (monoclonal gammopathy of unknown significance) Long-term history of MGUS.  Unlikely to be contributing to current presentation.  Follows with oncology.  Goals of care, counseling/discussion Goals of care discussion with patient and her son at bedside.  They would not want anything invasive done if she does not want resuscitation in the event of cardiac or pulmonary arrest.  Her goal at this time is to be able to go home with as much improvement of her symptoms as possible.  Her son, who helps her with medical decisions given her mild memory deficits, stated he understood the gravity of underlying cancer and obstruction.  He would like to focus on quality, not quantity.  - DNR/DNI  Advance Care Planning:   Code Status: Limited: Do not attempt resuscitation (DNR) -DNR-LIMITED -Do Not Intubate/DNI  Confirmed by patient and her son Youth worker)  Consults: General Surgery  Family Communication: Patient's son updated at bedside.   Severity of Illness: The appropriate patient status for this patient is INPATIENT. Inpatient status is judged to be reasonable and necessary in order to provide the required intensity of service to ensure the patient's safety. The patient's presenting symptoms, physical exam findings, and initial radiographic and laboratory data in the context of their chronic  comorbidities is felt to place them at high risk for further clinical deterioration. Furthermore, it is not anticipated that the patient will be medically stable for discharge from the hospital within 2 midnights of admission.   * I certify that at the point of admission it is my clinical judgment that the patient will require inpatient hospital care spanning beyond 2 midnights from  the point of admission due to high intensity of service, high risk for further deterioration and high frequency of surveillance required.*  Author: Verdene Lennert, MD 12/01/2023 8:02 PM  For on call review [email or web address].

## 2023-12-01 NOTE — Assessment & Plan Note (Addendum)
Goals of care discussion with patient and her son at bedside.  They would not want anything invasive done if she does not want resuscitation in the event of cardiac or pulmonary arrest.  Her goal at this time is to be able to go home with as much improvement of her symptoms as possible.  Her son, who helps her with medical decisions given her mild memory deficits, stated he understood the gravity of underlying cancer and obstruction.  He would like to focus on quality, not quantity.  - DNR/DNI -Will be going home with hospice.

## 2023-12-01 NOTE — Assessment & Plan Note (Addendum)
Patient is presenting with sudden abdominal pain over the last day found to have a high-grade SBO suspected to be malignant in addition to lymph and liver metastasis. Per chart review, she was seen for abdominal pain in mid October, at which time imaging demonstrated bowel wall thickening in the same region with previously present liver mass that was much smaller previously.  Overall, supporting malignant etiology.  Per discussion with patient and her son at bedside, they would not want anything invasive done.  They would like to see if medical management can provide her some relief.    Patient pulled NG tube out and does not want it. Palliative care met with patient and family and they would like to go back home with hospice services which are being arranged. - General Surgery consulted; appreciate their recommendations - Trial of Octreotide 200 mcg TID given patient's desire to treat medically - Trial of dexamethasone 8 mg IV daily with octreotide - Ativan as needed for anxiety - Started on clear liquid diet. - IV fluids - Electrolyte replacement as ordered

## 2023-12-02 DIAGNOSIS — Z515 Encounter for palliative care: Secondary | ICD-10-CM

## 2023-12-02 DIAGNOSIS — E1122 Type 2 diabetes mellitus with diabetic chronic kidney disease: Secondary | ICD-10-CM | POA: Diagnosis not present

## 2023-12-02 DIAGNOSIS — D472 Monoclonal gammopathy: Secondary | ICD-10-CM | POA: Diagnosis not present

## 2023-12-02 DIAGNOSIS — K56609 Unspecified intestinal obstruction, unspecified as to partial versus complete obstruction: Secondary | ICD-10-CM | POA: Diagnosis not present

## 2023-12-02 DIAGNOSIS — I10 Essential (primary) hypertension: Secondary | ICD-10-CM | POA: Diagnosis not present

## 2023-12-02 LAB — CBC
HCT: 28.1 % — ABNORMAL LOW (ref 36.0–46.0)
Hemoglobin: 9 g/dL — ABNORMAL LOW (ref 12.0–15.0)
MCH: 23.9 pg — ABNORMAL LOW (ref 26.0–34.0)
MCHC: 32 g/dL (ref 30.0–36.0)
MCV: 74.7 fL — ABNORMAL LOW (ref 80.0–100.0)
Platelets: 174 10*3/uL (ref 150–400)
RBC: 3.76 MIL/uL — ABNORMAL LOW (ref 3.87–5.11)
RDW: 15.9 % — ABNORMAL HIGH (ref 11.5–15.5)
WBC: 4.8 10*3/uL (ref 4.0–10.5)
nRBC: 0 % (ref 0.0–0.2)

## 2023-12-02 LAB — BASIC METABOLIC PANEL
Anion gap: 11 (ref 5–15)
BUN: 33 mg/dL — ABNORMAL HIGH (ref 8–23)
CO2: 22 mmol/L (ref 22–32)
Calcium: 8.4 mg/dL — ABNORMAL LOW (ref 8.9–10.3)
Chloride: 108 mmol/L (ref 98–111)
Creatinine, Ser: 1.2 mg/dL — ABNORMAL HIGH (ref 0.44–1.00)
GFR, Estimated: 42 mL/min — ABNORMAL LOW (ref >60)
Glucose, Bld: 159 mg/dL — ABNORMAL HIGH (ref 70–99)
Potassium: 4.9 mmol/L (ref 3.5–5.1)
Sodium: 141 mmol/L (ref 135–145)

## 2023-12-02 LAB — CBG MONITORING, ED: Glucose-Capillary: 140 mg/dL — ABNORMAL HIGH (ref 70–99)

## 2023-12-02 NOTE — Progress Notes (Signed)
Progress Note   Patient: Lauren Riddle WGN:[id number] DOB: [birth year] DOA: 12/01/2023     1 DOS: the patient was seen and examined on 12/02/2023   Brief hospital course: Taken from H&P.  Lauren Riddle is a 89 y.o. female with medical history significant of MGUS, type 2 diabetes, hypertension, hyperlipidemia, hypothyroidism, OSA not on CPAP, memory deficit, anemia of chronic renal disease, who presents to the ED due to abdominal pain, started about 2 to 3 weeks ago, improved when she took some steroid for neck pain, reoccurred after stopping steroid with gradual worsening.  Associated with some nausea but no vomiting.  Last BM was a day prior to admission.  On presentation, patient had stable vital,Initial workup notable for hemoglobin of 10.0, glucose 142, BUN 39, creatinine 1.28 with GFR 39. Lactic acid 1.7. CT of the abdomen was obtained that demonstrated high-grade small bowel obstruction secondary to 3.5 cm long irregular thickening of the ileocecal junction region concerning for neoplastic process, liver mass concerning for metastasis, enlarged right lower quadrant mesenteric lymph node. General surgery consulted with recommendation to place NG tube.   Patient was seen in mid October for abdominal pain and imaging at that time demonstrated bowel wall thickening in the same region with previously present liver mass that was much smaller previously.  Overall, supporting malignant etiology.  Family does not want any invasive procedure and would like to manage medically with focus on comfort.  Palliative care was consulted.  12/6: Vitals and labs stable, patient pulled her NG tube, no current nausea or vomiting.  Starting on clear liquid diet.  Patient and family wants to go back home with hospice.  Palliative care is making arrangements.  They do not want any further invasive diagnostic or therapeutic measures.  Patient has an history of advanced dementia.      Assessment and Plan: *  SBO (small bowel obstruction) (HCC) Patient is presenting with sudden abdominal pain over the last day found to have a high-grade SBO suspected to be malignant in addition to lymph and liver metastasis. Per chart review, she was seen for abdominal pain in mid October, at which time imaging demonstrated bowel wall thickening in the same region with previously present liver mass that was much smaller previously.  Overall, supporting malignant etiology.  Per discussion with patient and her son at bedside, they would not want anything invasive done.  They would like to see if medical management can provide her some relief.    Patient pulled NG tube out and does not want it. Palliative care met with patient and family and they would like to go back home with hospice services which are being arranged. - General Surgery consulted; appreciate their recommendations - Trial of Octreotide 200 mcg TID given patient's desire to treat medically - Trial of dexamethasone 8 mg IV daily with octreotide - Ativan as needed for anxiety - Started on clear liquid diet. - IV fluids - Electrolyte replacement as ordered  Type 2 diabetes mellitus with stage 3a chronic kidney disease, without long-term current use of insulin (HCC) Renal function currently at baseline.  - Daily BMP - SSI, very sensitive - Hold home regimen  Essential hypertension - Blood pressure currently within goal, initially home antihypertensives were held due to her being NPO. -We can restart as needed  MGUS (monoclonal gammopathy of unknown significance) Long-term history of MGUS.  Unlikely to be contributing to current presentation.  Follows with oncology.  Goals of care, counseling/discussion Goals of care discussion  with patient and her son at bedside.  They would not want anything invasive done if she does not want resuscitation in the event of cardiac or pulmonary arrest.  Her goal at this time is to be able to go home with as much  improvement of her symptoms as possible.  Her son, who helps her with medical decisions given her mild memory deficits, stated he understood the gravity of underlying cancer and obstruction.  He would like to focus on quality, not quantity.  - DNR/DNI -Will be going home with hospice.   Subjective: Patient denies any pain, no nausea or vomiting.  Son at bedside.  Physical Exam: Vitals:   12/02/23 0400 12/02/23 0700 12/02/23 0733 12/02/23 1000  BP: (!) 142/73 (!) 140/65  134/65  Pulse: 72 (!) 58  77  Resp:  18  16  Temp:   97.6 F (36.4 C)   TempSrc:   Oral   SpO2: 98% 99%  100%  Weight:      Height:       General.  Frail elderly lady, in no acute distress. Pulmonary.  Lungs clear bilaterally, normal respiratory effort. CV.  Regular rate and rhythm, no JVD, rub or murmur. Abdomen.  Soft, nontender, nondistended, BS positive. CNS.  Alert and oriented to self.  No focal neurologic deficit. Extremities.  No edema, no cyanosis, pulses intact and symmetrical.   Data Reviewed: Prior data reviewed  Family Communication: Discussed with son at bedside  Disposition: Status is: Inpatient Remains inpatient appropriate because: Severity of illness  Planned Discharge Destination:  Likely be home with hospice  Time spent: 50 minutes  This record has been created using Conservation officer, historic buildings. Errors have been sought and corrected,but may not always be located. Such creation errors do not reflect on the standard of care.   Author: Arnetha Courser, MD 12/02/2023 1:16 PM  For on call review [email or web address].

## 2023-12-02 NOTE — Hospital Course (Addendum)
Taken from H&P.  Lauren Riddle is a 89 y.o. female with medical history significant of MGUS, type 2 diabetes, hypertension, hyperlipidemia, hypothyroidism, OSA not on CPAP, memory deficit, anemia of chronic renal disease, who presents to the ED due to abdominal pain, started about 2 to 3 weeks ago, improved when she took some steroid for neck pain, reoccurred after stopping steroid with gradual worsening.  Associated with some nausea but no vomiting.  Last BM was a day prior to admission.  On presentation, patient had stable vital,Initial workup notable for hemoglobin of 10.0, glucose 142, BUN 39, creatinine 1.28 with GFR 39. Lactic acid 1.7. CT of the abdomen was obtained that demonstrated high-grade small bowel obstruction secondary to 3.5 cm long irregular thickening of the ileocecal junction region concerning for neoplastic process, liver mass concerning for metastasis, enlarged right lower quadrant mesenteric lymph node. General surgery consulted with recommendation to place NG tube.   Patient was seen in mid October for abdominal pain and imaging at that time demonstrated bowel wall thickening in the same region with previously present liver mass that was much smaller previously.  Overall, supporting malignant etiology.  Family does not want any invasive procedure and would like to manage medically with focus on comfort.  Palliative care was consulted.  12/6: Vitals and labs stable, patient pulled her NG tube, no current nausea or vomiting.  Starting on clear liquid diet.  Patient and family wants to go back home with hospice.  Palliative care is making arrangements.  They do not want any further invasive diagnostic or therapeutic measures.  Patient has an history of advanced dementia.

## 2023-12-02 NOTE — Progress Notes (Addendum)
[facility], Room ED 34 [facility] Liaison Note  Received request from Carmelia Bake, [place] , Transitions of Care Manager, for hospice services at home after discharge.  Met with patient's son  to initiate education related to hospice philosophy, services, and team approach to care.   Son verbalized understanding of information given.  Per discussion, the plan is for discharge home tomorrow, according to attending MD.  DME needs discussed.  Patient has the following equipment in the home: Cane. Patient/family requests the following equipment in the home: No DME needs at this time.    Please send signed and completed DNR home with the patient/family.  Please provide prescriptions at discharge as needed to ensure ongoing symptom management.    [facility] information and contact numbers given to son.  Above information shared with Carmelia Bake, LCSW, Transitions of Care Manager.     Please call with any Hospice related questions or concerns.  Thank you for the opportunity to participate in this patient's care.  Redge Gainer, [facility] Liaison [phone]

## 2023-12-02 NOTE — ED Notes (Addendum)
Upon going into Pts room to administer AM meds, Pt had ripped out her NGT, gotten out of bed and ripped her brief off. Pt was cleaned, new brief placed and provider notified of removal of NGT. Pt had 0 output in NGT. Bed alarm placed on, door remains open and other staff members around her room were notified to keep an eye on Pt. Pt encouraged to use call light that is attached to her gown if she needs anything

## 2023-12-02 NOTE — Consult Note (Signed)
Consultation Note Date: 12/02/2023   Patient Name: Lauren Riddle  DOB: 1926/04/02  MRN: [id number]  Age / Sex: 89 y.o., female  PCP: Luciana Axe, NP Referring Physician: Arnetha Courser, MD  Reason for Consultation: Establishing goals of care   HPI/Brief Hospital Course: 89 y.o. female  with past medical history of MGUS, type 2 diabetes, hypertension, hyperlipidemia, OSA not on CPAP, memory deficit, CKD with associated anemia admitted from home on 12/01/2023 with abdominal pain. Per report from son abdominal pain lasting at least 2 to 3 weeks but subsided temporarily after being prescribed short course of steroids for neck pain with ultimate return of worsening abdominal pain leading to hospital evaluation.  CT of abdomen revealing high-grade small bowel obstruction secondary to irregular thickening of the ileocecal junction concerning for neoplastic process, liver mass concerning for metastasis, enlarged right lower quadrant mesenteric lymph node. General surgery consulted with recommendation to place NG tube-NG tube removed by patient Per report Ms. Senteno clear and not wanting to pursue aggressive/invasive treatment  Palliative medicine was consulted for assisting with goals of care conversations.  Subjective:  Extensive chart review has been completed prior to meeting patient including labs, vital signs, imaging, progress notes, orders, and available advanced directive documents from current and previous encounters.  Visited with Ms. Lauren Riddle at her bedside.  She is awake, alert and oriented with moments of confusion.  Son Lauren Riddle at bedside during time of visit.  Introduced myself as a Publishing rights manager as a member of the palliative care team. Explained palliative medicine is specialized medical care for people living with serious illness. It focuses on providing relief from the symptoms and stress of a serious illness. The goal is to improve  quality of life for both the patient and the family.   [name] shares she is not currently married, multiple adult children, grandchildren and nieces and nephews.  She currently lives at home with her granddaughter, at baseline she remains fairly independent, able to complete her ADLs without assistance, ambulates with minimal assistance.  Son Lauren Riddle shares significant weight loss over the last 3 months feels in total it is about 30 pounds.  Lauren Riddle shares he and his sister Lauren Riddle share [address].  [name] able to share her understanding and is aware of findings of small bowel obstruction.  Lauren Riddle shares they have also been made aware of the likely underlying malignancy contributing to obstruction.  At this time [name] denies abdominal pain, nausea or any other symptoms of discomfort.  [name] and Lauren Riddle confirm she is not interested in pursuing anything invasive and are interested in pursuing hospice services within the home as previously discussed by primary team.  TOC engaged to offer agency choice for hospice services.  All questions/concerns addressed.  PMT will continue to follow and support patient as needed.  Objective: Primary Diagnoses: Present on Admission:  SBO (small bowel obstruction) (HCC)  Essential hypertension  MGUS (monoclonal gammopathy of unknown significance)  Type 2 diabetes mellitus with stage 3a chronic kidney disease, without long-term current use of insulin (HCC)   Vital Signs: BP (!) 151/76   Pulse 78   Temp 98.1 F (36.7 C) (Oral)   Resp 16   Ht 5\' 6"  (1.676 m)   Wt 63 kg   SpO2 100%   BMI 22.42 kg/m  Pain Scale: 0-10   Pain Score: 8   IO: Intake/output summary: No intake or output data in the 24 hours ending 12/02/23 1703  LBM:   Baseline  Weight: Weight: 63 kg Most recent weight: Weight: 63 kg       Assessment and Plan  SUMMARY OF RECOMMENDATIONS   Plan for home with hospice services following-TOC engaged   Palliative  Prophylaxis:   Bowel Regimen, Delirium Protocol and Frequent Pain Assessment   Discussed With: Primary team, TOC and nursing staff   Thank you for this consult and allowing Palliative Medicine to participate in the care of Lauren Riddle. Palliative medicine will continue to follow and assist as needed.   Time Total: 55 minutes  Time spent includes: Detailed review of medical records (labs, imaging, vital signs), medically appropriate exam (mental status, respiratory, cardiac, skin), discussed with treatment team, counseling and educating patient, family and staff, documenting clinical information, medication management and coordination of care.   Signed by: Leeanne Deed, DNP, AGNP-C Palliative Medicine    Please contact Palliative Medicine Team phone at [phone] for questions and concerns.  For individual provider: See Loretha Stapler

## 2023-12-03 DIAGNOSIS — I10 Essential (primary) hypertension: Secondary | ICD-10-CM | POA: Diagnosis not present

## 2023-12-03 DIAGNOSIS — E785 Hyperlipidemia, unspecified: Secondary | ICD-10-CM

## 2023-12-03 DIAGNOSIS — R4189 Other symptoms and signs involving cognitive functions and awareness: Secondary | ICD-10-CM

## 2023-12-03 DIAGNOSIS — N1831 Chronic kidney disease, stage 3a: Secondary | ICD-10-CM

## 2023-12-03 DIAGNOSIS — E1169 Type 2 diabetes mellitus with other specified complication: Secondary | ICD-10-CM

## 2023-12-03 DIAGNOSIS — K56609 Unspecified intestinal obstruction, unspecified as to partial versus complete obstruction: Secondary | ICD-10-CM | POA: Diagnosis not present

## 2023-12-03 DIAGNOSIS — E039 Hypothyroidism, unspecified: Secondary | ICD-10-CM

## 2023-12-03 MED ORDER — POLYETHYLENE GLYCOL 3350 17 G PO PACK: 17.0000 g | PACK | Freq: Every day | ORAL | 0 refills | Status: DC

## 2023-12-03 MED ORDER — POLYETHYLENE GLYCOL 3350 17 G PO PACK
17.0000 g | PACK | Freq: Every day | ORAL | Status: DC
  Administered 2023-12-03: 17 g via ORAL
  Filled 2023-12-03: qty 1

## 2023-12-03 NOTE — Assessment & Plan Note (Addendum)
Blood pressure has been stable, antihypertensive medications have been hold to avoid hypotension.  At the time of her discharge she will resume diltiazem and losartan.  Diuretic therapy with torsemide and hydrochlorothiazide.

## 2023-12-03 NOTE — Assessment & Plan Note (Signed)
 Long-term history of MGUS.  Unlikely to be contributing to current presentation.  Follows with oncology.

## 2023-12-03 NOTE — Plan of Care (Signed)

## 2023-12-03 NOTE — Assessment & Plan Note (Signed)
Patient is presenting with sudden abdominal pain over the last day found to have a high-grade SBO suspected to be malignant in addition to lymph and liver metastasis. Per chart review, she was seen for abdominal pain in mid October, at which time imaging demonstrated bowel wall thickening in the same region with previously present liver mass that was much smaller previously.  Overall, supporting malignant etiology.  Per discussion with patient and her son at bedside, they would not want anything invasive done.  They would like to see if medical management can provide her some relief.    Patient pulled NG tube out and does not want it. Palliative care met with patient and family and they would like to go back home with hospice services which are being arranged. - General Surgery consulted with recommendations to continue supportive medical care.   Patient received Octreotide and dexamethasone during her hospitalization.  Placed on IV fluids and clear liquid diet  By the time of her discharge her symptoms have improved, plan to continue care as outpatient with hospice.

## 2023-12-03 NOTE — Assessment & Plan Note (Signed)
Continue with donepezil.

## 2023-12-03 NOTE — Plan of Care (Signed)
IV removed, discharge instructions reviewed with son [name] over the phone, patient to be discharged to home with daughter

## 2023-12-03 NOTE — Plan of Care (Signed)

## 2023-12-03 NOTE — Assessment & Plan Note (Signed)
Goals of care discussion with patient and her son at bedside, per Dr Nelson Chimes.  They would not want anything invasive done if she does not want resuscitation in the event of cardiac or pulmonary arrest.  Her goal at this time is to be able to go home with as much improvement of her symptoms as possible.  Her son, who helps her with medical decisions given her mild memory deficits, stated he understood the gravity of underlying cancer and obstruction.  He would like to focus on quality, not quantity.  - DNR/DNI -Will be going home with hospice.

## 2023-12-03 NOTE — TOC Initial Note (Signed)
Transition of Care [facility]) - Initial/Assessment Note    Patient Details  Name: Lauren Riddle MRN: [id number] Date of Birth: 1926-07-29  Transition of Care [facility]) CM/SW Contact:    Liliana Cline, LCSW Phone Number: 12/03/2023, 10:49 AM  Clinical Narrative:                 Noted that patient was referred to [facility] by ED SW yesterday.  Patient is discharging home with son today with [facility]. [facility] Representative Diannia Ruder confirmed the plan. Son to transport. MD has updated the son.  No additional TOC needs identified.  Expected Discharge Plan: Home w Hospice Care Barriers to Discharge: Barriers Resolved   Patient Goals and CMS Choice            Expected Discharge Plan and Services         Expected Discharge Date: 12/03/23                                    Prior Living Arrangements/Services                       Activities of Daily Living   ADL Screening (condition at time of admission) Independently performs ADLs?: Yes (appropriate for developmental age) Is the patient deaf or have difficulty hearing?: No Does the patient have difficulty seeing, even when wearing glasses/contacts?: No Does the patient have difficulty concentrating, remembering, or making decisions?: No  Permission Sought/Granted                  Emotional Assessment              Admission diagnosis:  SBO (small bowel obstruction) (HCC) [K56.609] Generalized abdominal pain [R10.84] Patient Active Problem List   Diagnosis Date Noted   Cognitive impairment 12/03/2023   CKD stage 3a, GFR 45-59 ml/min (HCC) 12/03/2023   Hypothyroidism 12/03/2023   SBO (small bowel obstruction) (HCC) 12/01/2023   Goals of care, counseling/discussion 12/01/2023   Weight loss 07/15/2023   Panlobular emphysema (HCC) 12/02/2022   MGUS (monoclonal gammopathy of unknown significance) 09/07/2022   Anemia in chronic kidney disease (CKD) 08/24/2022    Thrombocytopenia (HCC) 08/24/2022   Cardiomegaly 06/18/2022   Peripheral edema 09/29/2021   Seasonal allergic rhinitis 09/29/2021   Situational mixed anxiety and depressive disorder 08/20/2021   Vertigo 04/30/2021   Primary osteoarthritis of both knees 10/30/2020   Memory disorder 05/05/2020   Other dietary vitamin B12 deficiency anemia 05/01/2020   Vitamin B12 deficiency 05/01/2020   Acquired hypothyroidism 04/29/2020   Essential hypertension 04/29/2020   Hypercholesterolemia 04/29/2020   Type 2 diabetes mellitus with hyperlipidemia (HCC) 04/29/2020   Vitamin D deficiency 04/29/2020   PCP:  Luciana Axe, NP Pharmacy:   [facility] [address] ([address]), [place] - [address] Jerilynn Mages [place]) [place] [address] Phone: [phone] Fax: [phone]     Social Determinants of Health (SDOH) Social History: SDOH Screenings   Food Insecurity: No Food Insecurity (12/02/2023)  Housing: Low Risk  (12/02/2023)  Transportation Needs: No Transportation Needs (12/02/2023)  Utilities: Not At Risk (12/02/2023)  Financial Resource Strain: Low Risk  (06/01/2023)   Received from [facility]  Tobacco Use: High Risk (12/01/2023)   SDOH Interventions:     Readmission Risk Interventions     No data to display

## 2023-12-03 NOTE — Assessment & Plan Note (Addendum)
Renal function remained stable, at the time of her discharge her serum cr is 1,20 with K at 4,9 and serum bicarbonate at 22.  Na 141  Patient is tolerating well clear liquid diet.  Patient at home on torsemide and hydrochlorothiazide, plus K supplementation.

## 2023-12-03 NOTE — Discharge Summary (Signed)
Physician Discharge Summary   Patient: Lauren Riddle MRN: [id number] DOB: 05-17-1926  Admit date:     12/01/2023  Discharge date: 12/03/23  Discharge Physician: York Ram Helmer Dull   PCP: Luciana Axe, NP   Recommendations at discharge:    Patient will continue care as outpatient under hospice, likely she has advance colonic malignancy. She and her family would like to avoid invasive/ aggressive measures. Hospice will follow up patient at home.  Added miralax to avoid constipation.  Advance diet as tolerated.  Resume her cardiovascular medications for now.  Follow up with Laren Everts NP in 7 to 10 days.  Follow up with Hospice as per protocol.   Discharge Diagnoses: Principal Problem:   SBO (small bowel obstruction) (HCC) Active Problems:   Type 2 diabetes mellitus with hyperlipidemia (HCC)   Essential hypertension   CKD stage 3a, GFR 45-59 ml/min (HCC)   Goals of care, counseling/discussion   Cognitive impairment   MGUS (monoclonal gammopathy of unknown significance)   Hypothyroidism  Resolved Problems:   * No resolved hospital problems. [facility] Course: Lauren Riddle was admitted to the hospital with the working diagnosis of small bowel obstruction.   89 y.o. female with medical history significant of MGUS, type 2 diabetes, hypertension, hyperlipidemia, hypothyroidism, OSA not on CPAP, memory deficit, anemia of chronic renal disease, who presents to the ED due to abdominal pain, started about 2 to 3 weeks ago, associated with some nausea but no vomiting.  Last BM was a day prior to admission. On her initial physical examination her blood pressure was 130/75, HR 83, 02 saturation 97%, lungs with no wheezing or rales, heart with S1 and S2 present and regular, with no rubs or murmurs, abdomen with distention and decreases bowel sounds, no lower extremity edema.   Na 142, K 3,5 Cl 103 bicarbonate 28 glucose 148, bun 39, cr 1,28  AST 15 ALT 12 Lactic acid 1,7   Wbc 5.4 hgb 10,0 plt 261   EKG 89 bpm, left axis deviation, left anterior fascicular block, sinus rhythm with no significant ST segment or T wave changes.   CT of the abdomen was obtained that demonstrated high-grade small bowel obstruction secondary to 3.5 cm long irregular thickening of the ileocecal junction region concerning for neoplastic process, liver mass concerning for metastasis, enlarged right lower quadrant mesenteric lymph node.  General surgery consulted with recommendation to place NG tube.   Patient was seen in mid October for abdominal pain and imaging at that time demonstrated bowel wall thickening in the same region with previously present liver mass that was much smaller previously.  Overall, supporting malignant etiology.  Family does not want any invasive procedure and would like to manage medically with focus on comfort.  Palliative care was consulted.  12/6: Vitals and labs stable, patient pulled her NG tube, no current nausea or vomiting.  Starting on clear liquid diet.  Patient and family wants to go back home with hospice.  Palliative care is making arrangements.  They do not want any further invasive diagnostic or therapeutic measures.  Patient has an history of advanced dementia.   12/07 patient with no nausea or vomiting, no abdominal pain, and positive bowel movement.  Plan to discharge home today with hospice services.    Assessment and Plan: * SBO (small bowel obstruction) (HCC) Patient is presenting with sudden abdominal pain over the last day found to have a high-grade SBO suspected to be malignant in addition to lymph and liver  metastasis. Per chart review, she was seen for abdominal pain in mid October, at which time imaging demonstrated bowel wall thickening in the same region with previously present liver mass that was much smaller previously.  Overall, supporting malignant etiology.  Per discussion with patient and her son at bedside, they would not want  anything invasive done.  They would like to see if medical management can provide her some relief.    Patient pulled NG tube out and does not want it. Palliative care met with patient and family and they would like to go back home with hospice services which are being arranged. - General Surgery consulted with recommendations to continue supportive medical care.   Patient received Octreotide and dexamethasone during her hospitalization.  Placed on IV fluids and clear liquid diet  By the time of her discharge her symptoms have improved, plan to continue care as outpatient with hospice.   Type 2 diabetes mellitus with hyperlipidemia (HCC) Patient was placed on insulin sliding scale for glucose cover and monitoring.  At the time of her discharge her fasting glucose is 159 mg/dl.  Continue metformin and glipizide   Continue statin therapy.     Essential hypertension Blood pressure has been stable, antihypertensive medications have been hold to avoid hypotension.  At the time of her discharge she will resume diltiazem and losartan.  Diuretic therapy with torsemide and hydrochlorothiazide.   CKD stage 3a, GFR 45-59 ml/min (HCC) Renal function remained stable, at the time of her discharge her serum cr is 1,20 with K at 4,9 and serum bicarbonate at 22.  Na 141  Patient is tolerating well clear liquid diet.  Patient at home on torsemide and hydrochlorothiazide, plus K supplementation.   Goals of care, counseling/discussion Goals of care discussion with patient and her son at bedside, per Dr Nelson Chimes.  They would not want anything invasive done if she does not want resuscitation in the event of cardiac or pulmonary arrest.  Her goal at this time is to be able to go home with as much improvement of her symptoms as possible.  Her son, who helps her with medical decisions given her mild memory deficits, stated he understood the gravity of underlying cancer and obstruction.  He would like to focus on  quality, not quantity.  - DNR/DNI -Will be going home with hospice.  Cognitive impairment Continue with donepezil.   MGUS (monoclonal gammopathy of unknown significance) Long-term history of MGUS.  Unlikely to be contributing to current presentation.  Follows with oncology.  Hypothyroidism Continue with levothyroxine.          Consultants: surgery, palliative care  Procedures performed: none   Disposition: Home Diet recommendation:  Discharge Diet Orders (From admission, onward)     Start     Ordered   12/03/23 0000  Diet - low sodium heart healthy        12/03/23 0935           Regular diet DISCHARGE MEDICATION: Allergies as of 12/03/2023   No Known Allergies      Medication List     STOP taking these medications    DSS 100 MG Caps   insulin aspart 100 UNIT/ML injection Commonly known as: novoLOG   meclizine 25 MG tablet Commonly known as: ANTIVERT   meloxicam 7.5 MG tablet Commonly known as: MOBIC       TAKE these medications    Accu-Chek Guide test strip Generic drug: glucose blood   Breo Ellipta 100-25 MCG/ACT Aepb  Generic drug: fluticasone furoate-vilanterol Inhale 1 puff into the lungs daily.   cholecalciferol 1000 units tablet Commonly known as: VITAMIN D Take 1,000 Units by mouth daily.   cyanocobalamin 1000 MCG tablet Commonly known as: VITAMIN B12 Take 2 tablets daily for 2 weeks, then reduce to 1 tablet daily thereafter for Vitamin B12 Deficiency.   cyclobenzaprine 5 MG tablet Commonly known as: FLEXERIL TAKE 1 TABLET BY MOUTH  TWICE DAILY AS NEEDED FOR  MUSCLE SPASMS FOR UP TO 10  DAYS   diltiazem 180 MG 24 hr capsule Commonly known as: CARDIZEM CD Take 180 mg by mouth daily.   donepezil 10 MG tablet Commonly known as: ARICEPT Take 10 mg by mouth at bedtime.   ferrous sulfate 325 (65 FE) MG tablet Take 1 tablet (325 mg total) by mouth 2 (two) times daily with a meal.   fluticasone 50 MCG/ACT nasal spray Commonly  known as: FLONASE Place 1 spray into both nostrils daily.   glipiZIDE 5 MG tablet Commonly known as: GLUCOTROL TAKE 1 TABLET BY MOUTH IN THE  MORNING BEFORE BREAKFAST   hydrochlorothiazide 25 MG tablet Commonly known as: HYDRODIURIL Take by mouth.   levothyroxine 50 MCG tablet Commonly known as: SYNTHROID Take 50 mcg by mouth daily before breakfast.   losartan 50 MG tablet Commonly known as: COZAAR Take by mouth.   lovastatin 20 MG tablet Commonly known as: MEVACOR Take by mouth.   metFORMIN 500 MG tablet Commonly known as: GLUCOPHAGE Take 500 mg by mouth daily with breakfast.   oxybutynin 5 MG 24 hr tablet Commonly known as: DITROPAN-XL Take by mouth at bedtime.   polyethylene glycol 17 g packet Commonly known as: MIRALAX / GLYCOLAX Take 17 g by mouth daily.   potassium chloride 8 MEQ tablet Commonly known as: KLOR-CON Take by mouth.   torsemide 5 MG tablet Commonly known as: DEMADEX Take 5 mg by mouth daily.   traZODone 50 MG tablet Commonly known as: DESYREL Take 50 mg by mouth at bedtime.        Follow-up Information     Luciana Axe, NP.   Specialty: Family Medicine Contact information: [address] [place] [address] [phone]                Discharge Exam: Ceasar Mons Weights   12/01/23 1127  Weight: 63 kg   BP (!) 177/85 (BP Location: Left Arm)   Pulse 67   Temp 97.8 F (36.6 C) (Oral)   Resp 18   Ht 5\' 6"  (1.676 m)   Wt 63 kg   SpO2 97%   BMI 22.42 kg/m   Patient is feeling well today, she has no nausea or vomiting, no abdominal pain. Positive bowel movement yesterday   Neurology awake and alert ENT with mild pallor Cardiovascular with S1 and S2 present and regular with no gallops, rubs or murmurs No JVD Trace non pitting lower extremity edema Respiratory with no rales or wheezing Abdomen is not distended and non tender. Bowel sounds are positive    Condition at discharge: stable  The results of  significant diagnostics from this hospitalization (including imaging, microbiology, ancillary and laboratory) are listed below for reference.   Imaging Studies: CT ABDOMEN PELVIS W CONTRAST  Result Date: 12/01/2023 CLINICAL DATA:  Bowel obstruction suspected Abdominal pain, acute, nonlocalized. * Tracking Code: BO * EXAM: CT ABDOMEN AND PELVIS WITH CONTRAST TECHNIQUE: Multidetector CT imaging of the abdomen and pelvis was performed using the standard protocol following bolus administration  of intravenous contrast. RADIATION DOSE REDUCTION: This exam was performed according to the departmental dose-optimization program which includes automated exposure control, adjustment of the mA and/or kV according to patient size and/or use of iterative reconstruction technique. CONTRAST:  60mL OMNIPAQUE IOHEXOL 300 MG/ML  SOLN COMPARISON:  CT scan abdomen and pelvis from 10/19/2023. FINDINGS: Lower chest: There are patchy atelectatic changes in the visualized lung bases. No overt consolidation. No pleural effusion. The heart is normal in size. No pericardial effusion. Hepatobiliary: The liver is normal in size. Non-cirrhotic configuration. Redemonstration of an ill-defined, hypoattenuating, approximately 2.2 x 2.6 cm lesion in the subcapsular right hepatic lobe, at the junction of segments 5/8, which is incompletely characterized on the current examination but appear grossly similar to the prior study. No intrahepatic or extrahepatic bile duct dilation. Gallbladder is surgically absent. Pancreas: Unremarkable. No pancreatic ductal dilatation or surrounding inflammatory changes. Spleen: Within normal limits. No focal lesion. Adrenals/Urinary Tract: Adrenal glands are unremarkable. No suspicious renal mass. No hydronephrosis. No renal or ureteric calculi. Unremarkable urinary bladder. Stomach/Bowel: There is an approximately 3.5 cm long focal irregular thickening measuring up to 9 mm, at the level of ileocecal junction  region, proximal to which small bowel loops are dilated measuring up to 5.1 cm in diameter. The colon is collapsed/nondilated. Findings are highly concerning for neoplastic process at ileocecal junction region. Correlation with colonoscopy and tissue sampling is recommended. The appendix is not distinctly visualized. Vascular/Lymphatic: No ascites or pneumoperitoneum. There is a single heterogeneous 1.3 x 1.5 cm right lower quadrant mesenteric lymph node, which is concerning for metastatic. No other abdominal or pelvic lymphadenopathy, by size criteria. No aneurysmal dilation of the major abdominal arteries. There are moderate peripheral atherosclerotic vascular calcifications of the aorta and its major branches. Reproductive: The uterus is surgically absent. No large adnexal mass. Other: There are bilateral tiny fat containing inguinal hernias. The soft tissues and abdominal wall are otherwise unremarkable. Musculoskeletal: No suspicious osseous lesions. There are mild - moderate multilevel degenerative changes in the visualized spine. IMPRESSION: 1. There is high-grade small-bowel obstruction secondary to a 3.5 cm long irregular thickening at the ileocecal junction region, which is highly concerning for neoplastic process. Correlation with colonoscopy and tissue sampling is recommended. 2. Redemonstration of an ill-defined hypoattenuating liver lesion, incompletely characterized on the current examination but grossly similar to the prior study. This is concerning for metastases. Further evaluation with nonemergent contrast-enhanced MRI abdomen as per liver mass protocol is recommended. 3. There is a single heterogeneous right lower quadrant mesenteric lymph node, also concerning for metastases. 4. Multiple other nonacute observations, as described above. Electronically Signed   By: Jules Schick M.D.   On: 12/01/2023 16:37    Microbiology: Results for orders placed or performed in visit on 10/22/19  Novel  Coronavirus, NAA ([facility])     Status: None   Collection Time: 10/22/19  1:03 PM   Specimen: Oropharyngeal(OP) collection in vial transport medium   OROPHARYNGEA  TESTING  Result Value Ref Range Status   SARS-CoV-2, NAA Not Detected Not Detected Final    Comment: This nucleic acid amplification test was developed and its performance characteristics determined by [employer]. Nucleic acid amplification tests include PCR and TMA. This test has not been FDA cleared or approved. This test has been authorized by FDA under an Emergency Use Authorization (EUA). This test is only authorized for the duration of time the declaration that circumstances exist justifying the authorization of the emergency use of in vitro diagnostic tests  for detection of SARS-CoV-2 virus and/or diagnosis of COVID-19 infection under section 564(b)(1) of the Act, 21 U.S.C. 244WNU-2(V) (1), unless the authorization is terminated or revoked sooner. When diagnostic testing is negative, the possibility of a false negative result should be considered in the context of a patient's recent exposures and the presence of clinical signs and symptoms consistent with COVID-19. An individual without symptoms of COVID-19 and who is not shedding SARS-CoV-2 virus would  expect to have a negative (not detected) result in this assay.     Labs: CBC: Recent Labs  Lab 12/01/23 1136 12/02/23 0635  WBC 5.4 4.8  HGB 10.0* 9.0*  HCT 31.6* 28.1*  MCV 74.2* 74.7*  PLT 261 174   Basic Metabolic Panel: Recent Labs  Lab 12/01/23 1136 12/02/23 0635  NA 142 141  K 3.5 4.9  CL 103 108  CO2 28 22  GLUCOSE 142* 159*  BUN 39* 33*  CREATININE 1.28* 1.20*  CALCIUM 9.2 8.4*  MG 2.1  --    Liver Function Tests: Recent Labs  Lab 12/01/23 1136  AST 15  ALT 12  ALKPHOS 55  BILITOT 0.6  PROT 7.5  ALBUMIN 3.6   CBG: Recent Labs  Lab 12/02/23 1135  GLUCAP 140*    Discharge time spent: greater than 30  minutes.  Signed: Coralie Keens, MD [facility] 12/03/2023

## 2023-12-03 NOTE — Assessment & Plan Note (Addendum)
Patient was placed on insulin sliding scale for glucose cover and monitoring.  At the time of her discharge her fasting glucose is 159 mg/dl.  Continue metformin and glipizide   Continue statin therapy.

## 2023-12-03 NOTE — Assessment & Plan Note (Signed)
Continue with levothyroxine  

## 2023-12-28 DEATH — deceased

## 2024-01-20 ENCOUNTER — Encounter: Payer: Self-pay | Admitting: Oncology
